# Patient Record
Sex: Female | Born: 1960 | Race: White | Hispanic: No | Smoking: Former smoker
Health system: Southern US, Community
[De-identification: ages and names within clinical notes are randomized; demographics above are authoritative.]

## PROBLEM LIST (undated history)

## (undated) DIAGNOSIS — K449 Diaphragmatic hernia without obstruction or gangrene: Secondary | ICD-10-CM

## (undated) DIAGNOSIS — R51 Headache: Secondary | ICD-10-CM

## (undated) DIAGNOSIS — M1990 Unspecified osteoarthritis, unspecified site: Secondary | ICD-10-CM

## (undated) DIAGNOSIS — E042 Nontoxic multinodular goiter: Secondary | ICD-10-CM

## (undated) DIAGNOSIS — K5730 Diverticulosis of large intestine without perforation or abscess without bleeding: Secondary | ICD-10-CM

## (undated) DIAGNOSIS — E785 Hyperlipidemia, unspecified: Secondary | ICD-10-CM

## (undated) DIAGNOSIS — Z800 Family history of malignant neoplasm of digestive organs: Secondary | ICD-10-CM

## (undated) DIAGNOSIS — E669 Obesity, unspecified: Secondary | ICD-10-CM

## (undated) DIAGNOSIS — G4730 Sleep apnea, unspecified: Secondary | ICD-10-CM

## (undated) DIAGNOSIS — E119 Type 2 diabetes mellitus without complications: Secondary | ICD-10-CM

## (undated) DIAGNOSIS — F329 Major depressive disorder, single episode, unspecified: Secondary | ICD-10-CM

## (undated) DIAGNOSIS — Z86010 Personal history of colonic polyps: Secondary | ICD-10-CM

## (undated) DIAGNOSIS — D649 Anemia, unspecified: Secondary | ICD-10-CM

## (undated) DIAGNOSIS — E7211 Homocystinuria: Secondary | ICD-10-CM

## (undated) DIAGNOSIS — N200 Calculus of kidney: Secondary | ICD-10-CM

## (undated) DIAGNOSIS — I10 Essential (primary) hypertension: Secondary | ICD-10-CM

## (undated) DIAGNOSIS — K219 Gastro-esophageal reflux disease without esophagitis: Secondary | ICD-10-CM

## (undated) DIAGNOSIS — E7212 Methylenetetrahydrofolate reductase deficiency: Secondary | ICD-10-CM

## (undated) DIAGNOSIS — F32A Depression, unspecified: Secondary | ICD-10-CM

## (undated) DIAGNOSIS — E039 Hypothyroidism, unspecified: Secondary | ICD-10-CM

## (undated) DIAGNOSIS — Z9889 Other specified postprocedural states: Secondary | ICD-10-CM

## (undated) DIAGNOSIS — R112 Nausea with vomiting, unspecified: Secondary | ICD-10-CM

## (undated) HISTORY — DX: Unspecified osteoarthritis, unspecified site: M19.90

## (undated) HISTORY — DX: Anemia, unspecified: D64.9

## (undated) HISTORY — PX: WISDOM TOOTH EXTRACTION: SHX21

## (undated) HISTORY — DX: Hypothyroidism, unspecified: E03.9

## (undated) HISTORY — DX: Homocystinuria: E72.12

## (undated) HISTORY — DX: Personal history of colonic polyps: Z86.010

## (undated) HISTORY — DX: Obesity, unspecified: E66.9

## (undated) HISTORY — PX: ABDOMINAL HYSTERECTOMY: SHX81

## (undated) HISTORY — DX: Methylenetetrahydrofolate reductase deficiency: E72.11

## (undated) HISTORY — DX: Major depressive disorder, single episode, unspecified: F32.9

## (undated) HISTORY — DX: Diaphragmatic hernia without obstruction or gangrene: K44.9

## (undated) HISTORY — DX: Type 2 diabetes mellitus without complications: E11.9

## (undated) HISTORY — DX: Nontoxic multinodular goiter: E04.2

## (undated) HISTORY — DX: Family history of malignant neoplasm of digestive organs: Z80.0

## (undated) HISTORY — DX: Depression, unspecified: F32.A

## (undated) HISTORY — DX: Essential (primary) hypertension: I10

## (undated) HISTORY — PX: BUNIONECTOMY: SHX129

## (undated) HISTORY — DX: Diverticulosis of large intestine without perforation or abscess without bleeding: K57.30

## (undated) HISTORY — DX: Hyperlipidemia, unspecified: E78.5

## (undated) HISTORY — DX: Calculus of kidney: N20.0

## (undated) HISTORY — DX: Gastro-esophageal reflux disease without esophagitis: K21.9

---

## 1988-11-05 HISTORY — PX: UMBILICAL HERNIA REPAIR: SHX196

## 1999-11-06 HISTORY — PX: OOPHORECTOMY: SHX86

## 1999-11-21 ENCOUNTER — Encounter (INDEPENDENT_AMBULATORY_CARE_PROVIDER_SITE_OTHER): Payer: Self-pay | Admitting: Specialist

## 1999-11-21 ENCOUNTER — Other Ambulatory Visit: Admission: RE | Admit: 1999-11-21 | Discharge: 1999-11-21 | Payer: Self-pay | Admitting: *Deleted

## 2000-03-14 ENCOUNTER — Other Ambulatory Visit: Admission: RE | Admit: 2000-03-14 | Discharge: 2000-03-14 | Payer: Self-pay | Admitting: Obstetrics and Gynecology

## 2000-10-16 ENCOUNTER — Encounter (INDEPENDENT_AMBULATORY_CARE_PROVIDER_SITE_OTHER): Payer: Self-pay

## 2000-10-16 ENCOUNTER — Inpatient Hospital Stay (HOSPITAL_COMMUNITY): Admission: RE | Admit: 2000-10-16 | Discharge: 2000-10-18 | Payer: Self-pay | Admitting: *Deleted

## 2002-06-09 ENCOUNTER — Other Ambulatory Visit: Admission: RE | Admit: 2002-06-09 | Discharge: 2002-06-09 | Payer: Self-pay | Admitting: *Deleted

## 2005-03-20 ENCOUNTER — Ambulatory Visit: Payer: Self-pay | Admitting: Gastroenterology

## 2007-08-06 DIAGNOSIS — Z860100 Personal history of colon polyps, unspecified: Secondary | ICD-10-CM

## 2007-08-06 HISTORY — DX: Personal history of colonic polyps: Z86.010

## 2007-08-06 HISTORY — DX: Personal history of colon polyps, unspecified: Z86.0100

## 2007-08-18 ENCOUNTER — Ambulatory Visit: Payer: Self-pay | Admitting: Gastroenterology

## 2007-09-01 ENCOUNTER — Ambulatory Visit (HOSPITAL_COMMUNITY): Admission: RE | Admit: 2007-09-01 | Discharge: 2007-09-01 | Payer: Self-pay | Admitting: Obstetrics & Gynecology

## 2007-09-03 ENCOUNTER — Encounter: Payer: Self-pay | Admitting: Gastroenterology

## 2007-09-03 ENCOUNTER — Ambulatory Visit: Payer: Self-pay | Admitting: Gastroenterology

## 2011-03-23 NOTE — Discharge Summary (Signed)
[facility]  Patient:    Alicia Lawson, Alicia Lawson                     MRN: [id number] Adm. Date:  [id number] Disc. Date: 10/18/00 Attending:  Pleas Koch                           Discharge Summary  ADMISSION DIAGNOSES:          Menometrorrhagia, family history of ovarian cancer.  DISCHARGE DIAGNOSES:          Menometrorrhagia, family history of ovarian cancer.  BRIEF HISTORY:                This is a 50 year old white female with a long history of menometrorrhagia unresponsive to all medical means.  Was admitted for total abdominal hysterectomy and bilateral salpingo-oophorectomy secondary to strong family history of ovarian cancer.  For details see history and physical.  HOSPITAL COURSE:              The patient was admitted on December 12 and underwent a total abdominal hysterectomy and bilateral salpingo-oophorectomy under general anesthetic.  The patient did well.  There was an operative blood loss of 250 cc.  Postoperatively the patient did have decreased heart rate and decreased O2 saturations while sleeping.  She was placed on oxygen and monitored in the AICU.  There was no abnormal rhythms.  There were good saturations on O2 which was then weaned off over the next 24 hours.  Patient was saturating 96% on room air by the first day.  She had good vital signs, good urinary output.  Postoperative hemoglobin was 7.4.  WBC 9.6.  By the first day she was also voiding without the catheter, ambulating.  She was advanced to low residue diet and passed flatus by the second day.  She was afebrile.  She was ambulating well.  Her incision was clean and dry.  The Alpha 200 incisional catheter was removed on the second day.  She was discharged in good condition.  A Climara 0.1 mg patch was placed which will stay on weekly then she will switch to Vivelle 0.1 changed twice weekly. Given a prescription for Percocet to take,  also detailed  discharge instructions.  She will return to the office in five days for staple removal. Call for fever, heavy bleeding, incisional difficulties, inadequate pain control, or inability to void. DD:  10/18/00 TD:  10/18/00 Job: [id number]

## 2011-03-23 NOTE — Op Note (Signed)
[facility]  Patient:    Alicia Lawson, Alicia Lawson                       MRN: [id number] Proc. Date: 10/16/00 Attending:  Georgina Peer, M.D.                           Operative Report  PREOPERATIVE DIAGNOSES:       1. Menometrorrhagia.                               2. Family history of ovarian and breast cancer.  POSTOPERATIVE DIAGNOSES:      1. Menometrorrhagia.                               2. Family history of ovarian and breast cancer.  OPERATION/PROCEDURE:          1. Total abdominal hysterectomy.                               2. Bilateral salpingo-oophorectomy.                               3. Placement of Alpha 200 incisional                                  anesthetic catheter.  SURGEON:                      Georgina Peer, M.D.  ASSISTANT:                    Rudy Jew. Ashley Royalty, M.D.  ANESTHESIA:                   General.  ESTIMATED BLOOD LOSS:         Estimated blood loss was 250 cc.  COMPLICATIONS:                None.  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  She desired permanent and comprehensive management of this bleeding problem and also wished ovarian removal because of a family history of ovarian and breast cancer.  DESCRIPTION OF PROCEDURE:     The patient was taken to the operating room after informed consent was obtained and given a general anesthetic and placed in the supine position.  The abdomen and vagina were prepped and draped sterilely and a catheter was placed in the bladder.  A transverse incision was made and taken into the peritoneal cavity in a normal Pfannenstiel manner.  A Balfour retractor was placed and the patient was placed in Trendelenburg position and the bowel was packed away.  A bladder blade was  placed.  Pelvic findings were noted.  The uterus was slightly mottled but top-normal size. The tubes and ovaries appeared normal.  There was no evidence of cyst, tumor, or endometriosis.  The upper abdomen exploration was normal and the appendix was normal.  Two Tresa Endo  clamps were placed across the cornu and the round ligaments bilaterally were divided with cautery.  The infundibulopelvic vessels were bilaterally identified and were free and clear of the ureters. These were ligated doubly and cut.  Bilaterally the uterine vessels were skeletonized.  An anterior bladder flap was made and the bladder pushed off the anterior uterine segment.  The uterine vessels bilaterally were clamped and cut and suture ligated.  The cardinal ligaments were clamped and cut and suture ligated.  The uterosacral ligaments were clamped and cut and suture ligated.  At the cervicovaginal angle the vagina was entered from the left side and Satinsky scissors were used to free the cervix from the vagina.  The cervix was then whip stitched with a running locking suture of Vicryl.  There was excellent hemostasis, with one area on the right side of the pedicles controlled with superficial sutures of 2-0 Vicryl.  The pelvis was irrigated and all blood and debris removed.  Again excellent hemostasis was noted.  The packs and retractors were removed.  The abdomen was closed with a fascial stitch of Vicryl.  An Alpha 200 catheter was placed in the subcutaneous area and then a subcutaneous stitch was placed.  Marcaine 0.25% 20 cc was infused into the subcutaneous area and the skin was closed with skin staples.  Sponge, needle, and instrument counts were correct.  Urine flow was clear at the end of the case. DD:  10/16/00 TD:  10/16/00 Job: [id number]

## 2011-04-20 ENCOUNTER — Encounter: Payer: Self-pay | Admitting: *Deleted

## 2011-04-20 ENCOUNTER — Telehealth: Payer: Self-pay | Admitting: Gastroenterology

## 2011-04-20 NOTE — Telephone Encounter (Signed)
Pt w/ hx of procedures only in systems. Hx of ECL 07/11/2001 showing Diverticulosis and HH. COLON 09/03/2007 with hyperplastic polyp - not hyperplastic or adenomatous changes or malignancy. Pt reports she does have hemorrhoids from time to time and that's probably the problem. If she went to her PCP, she might get referred, so she'll come here. Pt given an appt with Dr Jarold Motto for 04/26/11 at 0830am. I will mail her the New Pt form to complete. Pt reports blood on tissue after BM yesterday and then BRB after wiping yesterday and this am. Amy can we order some cream for her hemorrhoids prior to next week? Thanks.

## 2011-04-20 NOTE — Telephone Encounter (Signed)
Notified pt per Mike Gip, PA to use OTC Prep H until her visit on 04/26/11; pt stated understanding.

## 2011-04-20 NOTE — Telephone Encounter (Signed)
SHE CAN USE PREP H OTC UNTIL APPT- USE 3 TIMES DAILY

## 2011-04-26 ENCOUNTER — Ambulatory Visit (INDEPENDENT_AMBULATORY_CARE_PROVIDER_SITE_OTHER): Payer: 59 | Admitting: Gastroenterology

## 2011-04-26 ENCOUNTER — Encounter: Payer: Self-pay | Admitting: Gastroenterology

## 2011-04-26 ENCOUNTER — Other Ambulatory Visit (INDEPENDENT_AMBULATORY_CARE_PROVIDER_SITE_OTHER): Payer: 59

## 2011-04-26 DIAGNOSIS — K625 Hemorrhage of anus and rectum: Secondary | ICD-10-CM

## 2011-04-26 DIAGNOSIS — R109 Unspecified abdominal pain: Secondary | ICD-10-CM | POA: Insufficient documentation

## 2011-04-26 DIAGNOSIS — K649 Unspecified hemorrhoids: Secondary | ICD-10-CM | POA: Insufficient documentation

## 2011-04-26 DIAGNOSIS — K219 Gastro-esophageal reflux disease without esophagitis: Secondary | ICD-10-CM | POA: Insufficient documentation

## 2011-04-26 LAB — BASIC METABOLIC PANEL
Chloride: 110 mEq/L (ref 96–112)
Creatinine, Ser: 0.7 mg/dL (ref 0.4–1.2)
Potassium: 4.2 mEq/L (ref 3.5–5.1)

## 2011-04-26 LAB — CBC WITH DIFFERENTIAL/PLATELET
Basophils Absolute: 0 10*3/uL (ref 0.0–0.1)
Eosinophils Relative: 1.9 % (ref 0.0–5.0)
HCT: 39.7 % (ref 36.0–46.0)
Hemoglobin: 13.6 g/dL (ref 12.0–15.0)
MCV: 86.3 fl (ref 78.0–100.0)
Monocytes Relative: 5.8 % (ref 3.0–12.0)
Neutrophils Relative %: 68.6 % (ref 43.0–77.0)
Platelets: 262 10*3/uL (ref 150.0–400.0)
RBC: 4.6 Mil/uL (ref 3.87–5.11)
RDW: 14.9 % — ABNORMAL HIGH (ref 11.5–14.6)
WBC: 7.9 10*3/uL (ref 4.5–10.5)

## 2011-04-26 LAB — FOLATE: Folate: 12 ng/mL (ref >5.9)

## 2011-04-26 LAB — HEPATIC FUNCTION PANEL
AST: 22 U/L (ref 0–37)
Albumin: 3.9 g/dL (ref 3.5–5.2)
Alkaline Phosphatase: 91 U/L (ref 39–117)
Total Protein: 6.7 g/dL (ref 6.0–8.3)

## 2011-04-26 LAB — SEDIMENTATION RATE: Sed Rate: 29 mm/hr — ABNORMAL HIGH (ref 0–22)

## 2011-04-26 LAB — IBC PANEL: Saturation Ratios: 11 % — ABNORMAL LOW (ref 20.0–50.0)

## 2011-04-26 LAB — FERRITIN: Ferritin: 16.9 ng/mL (ref 10.0–291.0)

## 2011-04-26 MED ORDER — PEG-KCL-NACL-NASULF-NA ASC-C 100 G PO SOLR: 1.0000 | Freq: Once | ORAL | Status: DC

## 2011-04-26 MED ORDER — HYDROCORTISONE ACE-PRAMOXINE 1-1 % RE CREA: TOPICAL_CREAM | Freq: Two times a day (BID) | RECTAL | Status: AC

## 2011-04-26 NOTE — Progress Notes (Signed)
History of Present Illness:  This is a 50 year old Caucasian female with chronic gastroesophageal reflux disease managed fairly well with omeprazole 20 mg twice a day. She now complains of crampy abdominal pain, gas, bloating, intermittent bright red blood per rectum. She has a history of recurrent colon polyps but has been tested for genetic polyposis syndromes, results were negative. Her rectal bleeding is periodic and not associated with rectal/ abdominal pain. Her last colonoscopy was 4 years ago. The patient status post total abdominal hysterectomy and bilateral oopherectomy. The patient is concerned that she has been on PPIs for over 10 years, and wants repeat endoscopic exam. There multiple family members with colon polyps and colon carcinoma. 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 Other surgical procedures included repair of an umbilical hernia.  I have reviewed this patient's present history, medical and surgical past history, allergies and medications.    Past Medical History  Diagnosis Date  . Arthritis   . Diverticulosis of colon (without mention of hemorrhage)   . Esophageal reflux   . Hiatal hernia   . Family history of malignant neoplasm of gastrointestinal tract   . Personal history of colonic polyps 08/2007    hyperplastic  . Depression   . DM (diabetes mellitus)   . HTN (hypertension)   . Hypothyroidism   . Kidney stones   . Obesity    Past Surgical History  Procedure Date  . Abdominal hysterectomy   . Umbilical hernia repair 1990  . Bunionectomy     left    reports that she quit smoking about 27 years ago. Her smoking use included Cigarettes. She has never used smokeless tobacco. She reports that she drinks alcohol. She reports that she does not use illicit drugs. family history includes  Breast cancer in her paternal aunt; Cancer in her father; Cervical cancer in her maternal grandmother; Colon cancer in her maternal grandmother, paternal aunt, and paternal grandmother; Diabetes in an unspecified family member; Heart defect in her father; Hypertension in her mother; Lung cancer in her father; Macular degeneration in her father; Ovarian cancer in her mother; Stomach cancer in her paternal grandmother; and Thyroid disease in her father. No Known Allergies    ROS: The remainder of the 10 point ROS is negative//she does complain of allergic sinusitis, chronic fatigue, nonspecific headaches, peripheral edema, and urinary incontinency.     Physical Exam: General well developed well nourished patient in no acute distress, appearing her stated age Eyes PERRLA, no icterus, fundoscopic exam per opthamologist Skin no lesions noted Neck supple, no adenopathy, no thyroid enlargement, no tenderness Chest clear to percussion and auscultation Heart no significant murmurs, gallops or rubs noted Abdomen no hepatosplenomegaly masses or tenderness, BS normal.  Rectal inspection normal no fissures, or fistulae noted.  No masses or tenderness on digital exam. Stool guaiac negative. Anterior external hemorrhoidal tissue noted. No rectal masses or tenderness. Extremities no acute joint lesions, edema, phlebitis or evidence of cellulitis. Neurologic patient oriented x 3, cranial nerves intact, no focal neurologic deficits noted. Psychological mental status normal and normal affect.  Assessment and plan: Rectal bleeding probably from external hemorrhoid, rule out colonic polyposis per her very strong family history of colon cancer and polyps. I have asked her to do Sitz baths twice a day with local Analpram cream. Screening labs have been ordered; and she has been scheduled for colonoscopy at her convenience.  I reviewed antireflux regime with her, and we will continue omeprazole therapy pending  endoscopic exam.  Encounter Diagnoses  Name Primary?  . Rectal bleeding   . Hemorrhoids   . Abdominal pain

## 2011-04-26 NOTE — Patient Instructions (Signed)
Your procedure has been scheduled for 05/21/2011, please follow the seperate instructions.  Your prescription(s) have been sent to you pharmacy.  Please go to the basement today for your labs.

## 2011-04-27 ENCOUNTER — Telehealth: Payer: Self-pay | Admitting: *Deleted

## 2011-04-27 LAB — CELIAC PANEL 10
Gliadin IgG: 5.2 U/mL (ref <20)
IgA: 220 mg/dL (ref 69–380)
Tissue Transglutaminase Ab, IgA: 4.4 U/mL (ref <20)

## 2011-04-27 MED ORDER — TANDEM PLUS 162-115.2-1 MG PO CAPS: 1.0000 | ORAL_CAPSULE | Freq: Every day | ORAL | Status: DC

## 2011-04-27 NOTE — Telephone Encounter (Signed)
rx sent, left message she can call back if she has questions.

## 2011-04-27 NOTE — Telephone Encounter (Signed)
Message copied by Leonette Monarch on Fri Apr 27, 2011 12:18 PM ------      Message from: Jarold Motto, DAVID R      Created: Fri Apr 27, 2011 10:28 AM       Tandem daily

## 2011-05-01 ENCOUNTER — Telehealth: Payer: Self-pay | Admitting: Gastroenterology

## 2011-05-01 MED ORDER — FERROUS FUM-IRON POLYSACCH 162-115.2 MG PO CAPS: 1.0000 | ORAL_CAPSULE | Freq: Every day | ORAL | Status: DC

## 2011-05-01 NOTE — Telephone Encounter (Signed)
Pt would like tandem with no multivit, rx sent and rebate for movi prep mailed to pt.

## 2011-05-21 ENCOUNTER — Encounter: Payer: 59 | Admitting: Gastroenterology

## 2011-05-29 ENCOUNTER — Ambulatory Visit: Payer: Self-pay | Admitting: Gastroenterology

## 2011-06-11 ENCOUNTER — Encounter: Payer: Self-pay | Admitting: Gastroenterology

## 2011-06-11 ENCOUNTER — Ambulatory Visit (AMBULATORY_SURGERY_CENTER): Payer: 59 | Admitting: Gastroenterology

## 2011-06-11 DIAGNOSIS — K649 Unspecified hemorrhoids: Secondary | ICD-10-CM

## 2011-06-11 DIAGNOSIS — K219 Gastro-esophageal reflux disease without esophagitis: Secondary | ICD-10-CM

## 2011-06-11 DIAGNOSIS — R109 Unspecified abdominal pain: Secondary | ICD-10-CM | POA: Insufficient documentation

## 2011-06-11 DIAGNOSIS — Z8371 Family history of colonic polyps: Secondary | ICD-10-CM

## 2011-06-11 DIAGNOSIS — K644 Residual hemorrhoidal skin tags: Secondary | ICD-10-CM

## 2011-06-11 DIAGNOSIS — K5730 Diverticulosis of large intestine without perforation or abscess without bleeding: Secondary | ICD-10-CM

## 2011-06-11 DIAGNOSIS — K625 Hemorrhage of anus and rectum: Secondary | ICD-10-CM

## 2011-06-11 MED ORDER — SODIUM CHLORIDE 0.9 % IV SOLN: 500.0000 mL | INTRAVENOUS | Status: DC

## 2011-06-11 NOTE — Progress Notes (Signed)
When pt. Came to recovery and Dr. Jarold Motto at bedside she started crying and expressed concerns of her being awake and remembering the exam and told her she had a very difficult exam and a twisty colon and that the next exam she had she would need to be done under propofol. Pt. Still upset made team leader aware to address issue.Obtained clarification for pt. Dr. Jarold Motto stated she should repeat colonoscopy in 34yrs. Pt. Aware. Pt. Had questions about surgery for hiatal hernia Dr. Jarold Motto made aware and stated that he will schedule appt. With Dr. Daphine Deutscher, pt. Made aware.pt. Stated that she wanted to see Dr. Jarold Motto before going to see Dr. Daphine Deutscher. Dr. Jarold Motto made aware and stated that is was fine.

## 2011-06-11 NOTE — Patient Instructions (Addendum)
Resume all medications. Information given on diverticulosis and high fiber diet, D/C Instructions completed.Obtained clarification from Dr.Patterson  Verbally stated that he wants pt. To repeat procedure in 23yrs.

## 2011-06-12 ENCOUNTER — Telehealth: Payer: Self-pay

## 2011-06-12 ENCOUNTER — Encounter: Payer: Self-pay | Admitting: *Deleted

## 2011-06-12 NOTE — Telephone Encounter (Signed)
Follow up Call- Patient questions:  Do you have a fever, pain , or abdominal swelling? no Pain Score  0 *  Have you tolerated food without any problems? yes  Have you been able to return to your normal activities? no  Do you have any questions about your discharge instructions: Diet   no Medications  no Follow up visit  no  Do you have questions or concerns about your Care? no  Actions: * If pain score is 4 or above: No action needed, pain <4. Only concerns about discomfort of passing air. Able to expel air but discomfort goes away. Inform to call back with any questions or concerns.

## 2011-06-12 NOTE — Telephone Encounter (Signed)
Patient request work excuse for today. Request granted per Dr. Jarold Motto. Patient will have some one to to pick work excuse up for today.

## 2011-06-13 ENCOUNTER — Encounter: Payer: Self-pay | Admitting: Gastroenterology

## 2011-06-13 DIAGNOSIS — K219 Gastro-esophageal reflux disease without esophagitis: Secondary | ICD-10-CM

## 2011-06-13 LAB — HELICOBACTER PYLORI SCREEN-BIOPSY: UREASE: NEGATIVE

## 2011-06-15 ENCOUNTER — Encounter: Payer: Self-pay | Admitting: Gastroenterology

## 2011-06-18 ENCOUNTER — Encounter: Payer: Self-pay | Admitting: Gastroenterology

## 2011-06-20 ENCOUNTER — Encounter: Payer: Self-pay | Admitting: *Deleted

## 2011-06-22 ENCOUNTER — Encounter: Payer: Self-pay | Admitting: Gastroenterology

## 2011-06-22 ENCOUNTER — Ambulatory Visit (INDEPENDENT_AMBULATORY_CARE_PROVIDER_SITE_OTHER): Payer: 59 | Admitting: Gastroenterology

## 2011-06-22 VITALS — BP 128/84 | HR 78 | Ht 65.0 in | Wt 297.0 lb

## 2011-06-22 DIAGNOSIS — Z800 Family history of malignant neoplasm of digestive organs: Secondary | ICD-10-CM

## 2011-06-22 DIAGNOSIS — Z862 Personal history of diseases of the blood and blood-forming organs and certain disorders involving the immune mechanism: Secondary | ICD-10-CM

## 2011-06-22 DIAGNOSIS — E669 Obesity, unspecified: Secondary | ICD-10-CM

## 2011-06-22 DIAGNOSIS — K219 Gastro-esophageal reflux disease without esophagitis: Secondary | ICD-10-CM

## 2011-06-22 DIAGNOSIS — Z8639 Personal history of other endocrine, nutritional and metabolic disease: Secondary | ICD-10-CM

## 2011-06-22 DIAGNOSIS — K5730 Diverticulosis of large intestine without perforation or abscess without bleeding: Secondary | ICD-10-CM

## 2011-06-22 DIAGNOSIS — K449 Diaphragmatic hernia without obstruction or gangrene: Secondary | ICD-10-CM

## 2011-06-22 NOTE — Progress Notes (Signed)
History of Present Illness: This is a 50 year old Caucasian female with chronic obesity and thyroid dysfunction. She has had years of refractory acid reflux symptoms, and she currently is on twice a day Prilosec 40 mg and continues with regurgitation and burning substernal chest pain with some nocturnal awakening. Recent endoscopy showed a prominent hiatal hernia and early scarring of the GE junction. Colonoscopy showed a long redundant colon with diverticulosis. She has had previous 24-hour pH probe testing which is confirmed severe acid reflux upright and supine positions. I cannot find a manometry report. There is no history of Raynaud's phenomenon or collagen vascular disease. The patient is miserable with her condition and seeks surgical repair of her hiatal hernia. In the past she has been on Reglan without improvement. She does have a family history of colon cancer, but recent colonoscopy was unremarkable. Genetic testing for Lynch syndrome was negative in 2003. I have discussed surgical repair of her hiatal hernia since 2003.   Current Medications, Allergies, Past Medical History, Past Surgical History, Family History and Social History were reviewed in [employer] record.   Assessment and plan: Refractory acid reflux in an obese patient who has been unable to maintain any adequate weight loss. She has chronic hypothyroidism and is on Synthroid. I have scheduled her for esophageal manometry and referral to Dr. Luretha Murphy for consideration of laparoscopic hiatal hernia repair. I have reviewed a reflux regime with the patient again and will continue her twice a day PPI therapy. Encounter Diagnosis  Name Primary?  . Hiatal hernia Yes

## 2011-06-22 NOTE — Patient Instructions (Signed)
Your Manometry is scheduled for 07/23/2011, please follow the separate instructions.  We have referred you to Dr Luretha Murphy at [facility], either our office or theirs will give you a call back about the date and time.  You watched a movie on Gerd in the office today.  Your Colonoscopy recall is in the system for 2017 you will receive a letter in the mail when it is time for you to schedule.

## 2011-06-26 ENCOUNTER — Telehealth: Payer: Self-pay | Admitting: *Deleted

## 2011-06-26 NOTE — Telephone Encounter (Signed)
Received a call from [place] at [facility] informing us that pt has an appt with Dr Wenda Low on 07/25/11 at 1pm for a 1:30appt to discuss her Hiatal Hernia. Informed pt and mailed her directions to the office.

## 2011-07-06 ENCOUNTER — Other Ambulatory Visit: Payer: Self-pay | Admitting: *Deleted

## 2011-07-06 MED ORDER — HYDROCORTISONE ACE-PRAMOXINE 1-1 % RE CREA: TOPICAL_CREAM | Freq: Two times a day (BID) | RECTAL | Status: AC

## 2011-07-12 ENCOUNTER — Telehealth: Payer: Self-pay | Admitting: Gastroenterology

## 2011-07-12 NOTE — Telephone Encounter (Signed)
Pt lost her instructions for EM and 24hr PH Probe for 07/23/11 at 0900am at [facility]. Instructed pt to stop her Prilosec 3 days prior to exam, she may use antacids up to 12 hours prior to the test. Verified appt time with Noreene Larsson and she is to check in at admitting at 0840am; pt stated understanding.

## 2011-07-23 ENCOUNTER — Ambulatory Visit (HOSPITAL_COMMUNITY)
Admission: RE | Admit: 2011-07-23 | Discharge: 2011-07-23 | Disposition: A | Discharge status: Home / Self Care | Payer: 59 | Source: Ambulatory Visit | Attending: Gastroenterology | Admitting: Gastroenterology

## 2011-07-23 ENCOUNTER — Encounter (INDEPENDENT_AMBULATORY_CARE_PROVIDER_SITE_OTHER): Payer: 59 | Admitting: Gastroenterology

## 2011-07-23 DIAGNOSIS — K449 Diaphragmatic hernia without obstruction or gangrene: Secondary | ICD-10-CM

## 2011-07-23 DIAGNOSIS — K219 Gastro-esophageal reflux disease without esophagitis: Secondary | ICD-10-CM | POA: Insufficient documentation

## 2011-07-23 NOTE — Progress Notes (Signed)
  Esophageal manometry was completed on 07/23/2011. Results are as follows:  #1 upper esophageal sphincter-there is normal coordination between pharyngeal contraction and cricopharyngeal relaxation.  #2 lower esophageal sphincter-mean pressure is normal at 23 mm mercury with normal relaxation swallowing.  #3 motility pattern-the normally propagated peristaltic waves throughout the length of the esophagus. Mean amplitude of contractions is 87 mm of mercury.  Assessment: This is a normal esophageal manometry without any evidence of LES incompetency or an esophageal motility disorder.

## 2011-07-25 ENCOUNTER — Encounter (INDEPENDENT_AMBULATORY_CARE_PROVIDER_SITE_OTHER): Payer: Self-pay | Admitting: Surgery

## 2011-07-25 ENCOUNTER — Ambulatory Visit (INDEPENDENT_AMBULATORY_CARE_PROVIDER_SITE_OTHER): Payer: 59 | Admitting: Surgery

## 2011-07-25 DIAGNOSIS — K219 Gastro-esophageal reflux disease without esophagitis: Secondary | ICD-10-CM

## 2011-07-25 NOTE — Progress Notes (Signed)
Addended by: Latricia Heft on: 07/25/2011 03:45 PM   Modules accepted: Orders

## 2011-07-25 NOTE — Progress Notes (Signed)
Alicia Lawson comes in today for gastroesophageal reflux disease. She has however  A BMI of 51. We had a long talk about the LAP-BAND with hiatal hernia repair in the successful treatment of gastroesophageal reflux disease as well as weight loss. She's had prediabetes in the past. I gave her some information on the LAP-BAND as well as a laparoscopic Nissen fundoplication. I drew her pictures of both procedures explain him in some detail.  Before proceeding forward I will get an upper GI series to assess her hiatal hernia. I will see her back after that and based on that if she decided she wanted to pursue LAP-BAND we will then have her see Okey Regal for workup for a laparoscopic adjustable gastric banding.  Plan upper GI series, repeat complete metabolic panel, hemoglobin A1c.

## 2011-07-26 ENCOUNTER — Encounter: Payer: Self-pay | Admitting: Gastroenterology

## 2011-07-30 ENCOUNTER — Other Ambulatory Visit (INDEPENDENT_AMBULATORY_CARE_PROVIDER_SITE_OTHER): Payer: Self-pay | Admitting: Surgery

## 2011-07-30 ENCOUNTER — Ambulatory Visit
Admission: RE | Admit: 2011-07-30 | Discharge: 2011-07-30 | Disposition: A | Discharge status: Home / Self Care | Payer: 59 | Source: Ambulatory Visit | Attending: Surgery | Admitting: Surgery

## 2011-07-30 DIAGNOSIS — K219 Gastro-esophageal reflux disease without esophagitis: Secondary | ICD-10-CM

## 2011-07-30 LAB — COMPREHENSIVE METABOLIC PANEL
AST: 17 U/L (ref 0–37)
Albumin: 4.2 g/dL (ref 3.5–5.2)
Alkaline Phosphatase: 79 U/L (ref 39–117)
BUN: 13 mg/dL (ref 6–23)
Potassium: 4.6 mEq/L (ref 3.5–5.3)
Sodium: 144 mEq/L (ref 135–145)
Total Bilirubin: 0.4 mg/dL (ref 0.3–1.2)

## 2011-07-30 LAB — HEMOGLOBIN A1C: Mean Plasma Glucose: 120 mg/dL — ABNORMAL HIGH (ref <117)

## 2011-08-06 DIAGNOSIS — E042 Nontoxic multinodular goiter: Secondary | ICD-10-CM

## 2011-08-06 HISTORY — DX: Nontoxic multinodular goiter: E04.2

## 2011-08-09 ENCOUNTER — Telehealth (INDEPENDENT_AMBULATORY_CARE_PROVIDER_SITE_OTHER): Payer: Self-pay | Admitting: General Surgery

## 2011-08-09 NOTE — Telephone Encounter (Addendum)
The patient Alicia Lawson on my VM regarding scheduling an appt to follow up on her Hiatal Hernia and results to UGI tried to call the patient back and no answer, Alicia Lawson for her to call me back to schedule. Pt scheduled to come in to office on 09/26/11 she has been put on the wait list for a cancellation if there is a sooner appt.

## 2011-08-14 ENCOUNTER — Telehealth (INDEPENDENT_AMBULATORY_CARE_PROVIDER_SITE_OTHER): Payer: Self-pay | Admitting: General Surgery

## 2011-08-14 NOTE — Telephone Encounter (Signed)
Patient would like lab results sent to her family physician. Also she is wanting to see if she would be a candidate for lap band. She is scheduled for a follow up 09/26/11

## 2011-08-22 ENCOUNTER — Other Ambulatory Visit: Payer: Self-pay | Admitting: Family Medicine

## 2011-08-22 DIAGNOSIS — E049 Nontoxic goiter, unspecified: Secondary | ICD-10-CM

## 2011-08-27 ENCOUNTER — Ambulatory Visit
Admission: RE | Admit: 2011-08-27 | Discharge: 2011-08-27 | Disposition: A | Discharge status: Home / Self Care | Payer: 59 | Source: Ambulatory Visit | Attending: Family Medicine | Admitting: Family Medicine

## 2011-08-27 DIAGNOSIS — E049 Nontoxic goiter, unspecified: Secondary | ICD-10-CM

## 2011-09-26 ENCOUNTER — Other Ambulatory Visit (INDEPENDENT_AMBULATORY_CARE_PROVIDER_SITE_OTHER): Payer: Self-pay | Admitting: General Surgery

## 2011-09-26 ENCOUNTER — Ambulatory Visit (INDEPENDENT_AMBULATORY_CARE_PROVIDER_SITE_OTHER): Payer: 59 | Admitting: Surgery

## 2011-09-26 ENCOUNTER — Encounter (INDEPENDENT_AMBULATORY_CARE_PROVIDER_SITE_OTHER): Payer: Self-pay | Admitting: Surgery

## 2011-09-26 DIAGNOSIS — K219 Gastro-esophageal reflux disease without esophagitis: Secondary | ICD-10-CM

## 2011-09-26 DIAGNOSIS — E669 Obesity, unspecified: Secondary | ICD-10-CM

## 2011-09-26 NOTE — Progress Notes (Signed)
Patient ID: Alicia Lawson, female   DOB: 1960/12/02, 50 y.o.   MRN: [id number] Chief Complaint:  Severe GERD with pyrosis and mechanical reflux at night  History of Present Illness:  Alicia Lawson is an 50 y.o. female who was referred to me for her severe GERD. Without proton pump inhibitors she would have constant chest pain. She describes reflux with aspiration. An upper GI demonstrated a small hiatal hernia.  Her current BMI is in the upper 40s. I think that she would be best managed with a lap band with hiatal hernia repair. I think this would have greater success than a Nissen fundoplication if the patient is overweight.she wants to pursue this. She has been to one of our weight loss seminars. She has read the book on LAP-BAND. She has 5 years of weights from her doctor, Cephus Richer in [place].    Past Medical History  Diagnosis Date  . Arthritis   . Diverticulosis of colon (without mention of hemorrhage)   . Esophageal reflux   . Family history of malignant neoplasm of gastrointestinal tract   . Personal history of colonic polyps 08/2007    hyperplastic  . Depression   . DM (diabetes mellitus)   . HTN (hypertension)   . Obesity   . Kidney stones   . Hiatal hernia   . Hypothyroidism     hashimoto  . Diverticulosis of colon (without mention of hemorrhage)   . Anemia   . Hyperlipidemia   . Multiple thyroid nodules October 2012     Past Surgical History  Procedure Date  . Abdominal hysterectomy   . Umbilical hernia repair 1990  . Bunionectomy     left  . Wisdom tooth extraction   . Oophorectomy 2001    Medications Prior to Admission  Medication Sig Dispense Refill  . AMBULATORY NON FORMULARY MEDICATION Estiol cream 2.5 use topically Mon-Fri       . Ascorbic Acid (VITAMIN C) 1000 MG tablet Take 1,000 mg by mouth 3 (three) times daily.        . Cholecalciferol (VITAMIN D) 1000 UNITS capsule Take 2-3 by mouth daily       . clobetasol (TEMOVATE) 0.05 % ointment Apply topically  as needed.      . Diclofenac-Misoprostol (ARTHROTEC PO) Take by mouth daily.        . ferrous fumarate-iron polysaccharide complex (TANDEM) 162-115.2 MG CAPS Take 1 capsule by mouth daily with breakfast.  30 capsule  6  . fluticasone (FLONASE) 50 MCG/ACT nasal spray       . losartan (COZAAR) 50 MG tablet       . Multiple Vitamin (MULTIVITAMIN) tablet Take 1 tablet by mouth daily.        . Multiple Vitamins-Minerals (VISION VITAMINS) TABS Take by mouth 1 day or 1 dose.        . Omega-3 Fatty Acids (FISH OIL) 1200 MG CAPS Take 3-6 by mouth daily       . omeprazole (PRILOSEC) 40 MG capsule Take 40 mg by mouth 2 (two) times daily.        . progesterone (PROMETRIUM) 100 MG capsule Take 100 mg by mouth daily.        . solifenacin (VESICARE) 5 MG tablet Take 10 mg by mouth daily.        Marland Kitchen thyroid (ARMOUR THYROID) 60 MG tablet Take 60 mg by mouth daily.        . traZODone (DESYREL) 50 MG tablet daily.  Medications Prior to Admission  Medication Dose Route Frequency Provider Last Rate Last Dose  . 0.9 %  sodium chloride infusion  500 mL Intravenous Continuous Sheryn Bison, MD       No Known Allergies Family History  Problem Relation Age of Onset  . Thyroid disease Father   . Heart defect Father   . Cancer Father     bladder and lung  . Macular degeneration Father   . Lung cancer Father   . Ovarian cancer Mother   . Hypertension Mother   . Cancer Mother     ovarian  . Colon cancer Paternal Grandmother   . Cancer Paternal Grandmother     colon  . Cervical cancer Maternal Grandmother   . Cancer Maternal Grandmother     cervical  . Colon cancer Paternal Aunt   . Cancer Paternal Aunt     breast  . Breast cancer Paternal Aunt   . Diabetes      grandparents  . Esophageal cancer Neg Hx   . Colon cancer Cousin    Social History:   reports that she quit smoking about 27 years ago. Her smoking use included Cigarettes. She has never used smokeless tobacco. She reports that she drinks  alcohol. She reports that she does not use illicit drugs.   REVIEW OF SYSTEMS - PERTINENT POSITIVES ONLY: Nothing notable except as in HPI  Physical Exam:   Blood pressure 146/94, pulse 68, temperature 97.2 F (36.2 C), temperature source Temporal, resp. rate 16, height 5' 5.5" (1.664 m), weight 306 lb 6 oz (138.971 kg). Body mass index is 50.21 kg/(m^2).  Gen:  No acute distress.  Well nourished and well groomed.   Neurological: Alert and oriented to person, place, and time. Coordination normal.  Head: Normocephalic and atraumatic.  Eyes: Conjunctivae are normal. Pupils are equal, round, and reactive to light. No scleral icterus.  Neck: Normal range of motion. Neck supple. No tracheal deviation or thyromegaly present.  Cardiovascular:  SR without murmurs or gallops Respiratory: Effort normal.  No respiratory distress. No chest wall tenderness. Breath sounds normal.  No wheezes, rales or rhonchi.  GI: Soft. Bowel sounds are normal. The abdomen is soft and nontender.  There is no rebound and no guarding.Obese GU: Musculoskeletal: Normal range of motion. Extremities are nontender.  Lymphadenopathy: No cervical, preauricular, postauricular or axillary adenopathy is present Skin: Skin is warm and dry. No rash noted. No diaphoresis. No erythema. No pallor. No clubbing, cyanosis, or edema.  Pscyh: Normal mood and affect. Behavior is normal. Judgment and thought content normal.   LABORATORY RESULTS: No results found for this or any previous visit (from the past 48 hour(s)).  RADIOLOGY RESULTS: No results found.  Problem List: Active Problems:  * No active hospital problems. *    Assessment & Plan: I think she is a good candidate for a lap band hiatal hernia. Will begin the workup for getting [place] certified with [place] [facility].    Matt B. Daphine Deutscher, MD, [facility] [phone] beeper [phone]  09/26/2011 4:12 PM

## 2011-10-01 ENCOUNTER — Other Ambulatory Visit: Payer: Self-pay | Admitting: Family Medicine

## 2011-10-01 DIAGNOSIS — Z1231 Encounter for screening mammogram for malignant neoplasm of breast: Secondary | ICD-10-CM

## 2011-10-03 ENCOUNTER — Other Ambulatory Visit: Payer: Self-pay | Admitting: Endocrinology

## 2011-10-03 DIAGNOSIS — E042 Nontoxic multinodular goiter: Secondary | ICD-10-CM

## 2011-10-04 ENCOUNTER — Ambulatory Visit
Admission: RE | Admit: 2011-10-04 | Discharge: 2011-10-04 | Disposition: A | Discharge status: Home / Self Care | Payer: 59 | Source: Ambulatory Visit | Attending: Endocrinology | Admitting: Endocrinology

## 2011-10-04 ENCOUNTER — Other Ambulatory Visit (INDEPENDENT_AMBULATORY_CARE_PROVIDER_SITE_OTHER): Payer: Self-pay | Admitting: Surgery

## 2011-10-04 ENCOUNTER — Other Ambulatory Visit (HOSPITAL_COMMUNITY)
Admission: RE | Admit: 2011-10-04 | Discharge: 2011-10-04 | Disposition: A | Discharge status: Home / Self Care | Payer: 59 | Source: Ambulatory Visit | Attending: Radiology | Admitting: Radiology

## 2011-10-04 DIAGNOSIS — E042 Nontoxic multinodular goiter: Secondary | ICD-10-CM

## 2011-10-04 DIAGNOSIS — E049 Nontoxic goiter, unspecified: Secondary | ICD-10-CM | POA: Insufficient documentation

## 2011-10-04 NOTE — Procedures (Signed)
US guided needle aspirate biopsies performed of both right upper pole and left mid pole thyroid nodules(x3 each) via 25 gauge needles. No immediate complications. Final pathology pending.

## 2011-10-05 LAB — COMPREHENSIVE METABOLIC PANEL
ALT: 22 U/L (ref 0–35)
AST: 15 U/L (ref 0–37)
Alkaline Phosphatase: 85 U/L (ref 39–117)
CO2: 25 mEq/L (ref 19–32)
Creat: 0.81 mg/dL (ref 0.50–1.10)
Sodium: 142 mEq/L (ref 135–145)
Total Bilirubin: 0.4 mg/dL (ref 0.3–1.2)
Total Protein: 6.6 g/dL (ref 6.0–8.3)

## 2011-10-05 LAB — CBC
MCH: 29 pg (ref 26.0–34.0)
MCV: 87.5 fL (ref 78.0–100.0)
Platelets: 255 10*3/uL (ref 150–400)
RBC: 4.73 MIL/uL (ref 3.87–5.11)
RDW: 13.9 % (ref 11.5–15.5)

## 2011-10-05 LAB — TSH: TSH: 0.777 u[IU]/mL (ref 0.350–4.500)

## 2011-10-05 LAB — HEMOGLOBIN A1C: Mean Plasma Glucose: 117 mg/dL — ABNORMAL HIGH (ref <117)

## 2011-10-10 ENCOUNTER — Ambulatory Visit
Admission: RE | Admit: 2011-10-10 | Discharge: 2011-10-10 | Disposition: A | Discharge status: Home / Self Care | Payer: 59 | Source: Ambulatory Visit | Attending: Family Medicine | Admitting: Family Medicine

## 2011-10-10 DIAGNOSIS — Z1231 Encounter for screening mammogram for malignant neoplasm of breast: Secondary | ICD-10-CM

## 2011-10-17 ENCOUNTER — Encounter: Payer: 59 | Attending: Surgery | Admitting: *Deleted

## 2011-10-17 ENCOUNTER — Encounter: Payer: Self-pay | Admitting: *Deleted

## 2011-10-17 DIAGNOSIS — Z01818 Encounter for other preprocedural examination: Secondary | ICD-10-CM | POA: Insufficient documentation

## 2011-10-17 DIAGNOSIS — Z713 Dietary counseling and surveillance: Secondary | ICD-10-CM | POA: Insufficient documentation

## 2011-10-17 NOTE — Patient Instructions (Signed)
   Follow Pre-Op Nutrition Goals to prepare for Gastric Bypass Surgery.   Call the [facility] at [phone] once you have been given your surgery date to enrolled in the Pre-Op Nutrition Class. You will need to attend this nutrition class 3-4 weeks prior to your surgery. 

## 2011-10-17 NOTE — Progress Notes (Signed)
  Pre-Op Assessment Visit: Pre-Operative Gastric Bypass Surgery  Medical Nutrition Therapy:  Appt start time: 1200 end time:  1300.  Patient was seen on 10/17/2011 for Pre-Operative LAGB Nutrition Assessment. Assessment and letter of approval faxed to [facility] B[facility]ric [facility] Program coordinator on 10/17/2011.  Approval letter sent to [facility] center and will be available in the chart under the media tab.  Handouts given during visit include:  Pre-Op Goals Handout  Patient to call for Pre-Op and Post-Op Nutrition Education at the [facility] when surgery is scheduled.

## 2011-10-25 ENCOUNTER — Encounter (INDEPENDENT_AMBULATORY_CARE_PROVIDER_SITE_OTHER): Payer: Self-pay

## 2011-11-16 ENCOUNTER — Other Ambulatory Visit: Payer: Self-pay | Admitting: Dermatology

## 2011-12-12 NOTE — Progress Notes (Signed)
  Bariatric Class:  Appt start time: 0830 end time:  0930.  Pre-Operative Nutrition Class  Patient was seen on 12/13/11  for Pre-Operative Bariatric Surgery Education at the [facility].  Surgery date: 01/08/12 Surgery type: LAGB  Last weight: 304.5 lbs  Samples given per MNT protocol: Bariatric Advantage Multivitamin Lot # [id number] Exp: 9/13  Bariatric Advantage Calcium Citrate Lot # [id number] Exp: 10/13  Celebrate Vitamins Multivitamin Lot # [id number] Exp: 6/14  Celebrate VitaminsCalcium Citrate Lot # [id number] Exp: 5/14  Jerrilyn [place] Lot# [id number] Exp: 5/14  The following the learning objective met by the patient during this course:   Identifies Pre-Op Dietary Goals and will begin 2 weeks pre-operatively   Identifies appropriate sources of fluids and proteins   States protein recommendations and appropriate sources pre and post-operatively  Identifies Post-Operative Dietary Goals and will follow for 2 weeks post-operatively  Identifies appropriate multivitamin and calcium sources  Describes the need for physical activity post-operatively and will follow MD recommendations  States when to call healthcare provider regarding medication questions or post-operative complications  Handouts given during class include:  Pre-Op Bariatric Surgery Diet Handout  Protein Shake Handout  Post-Op Bariatric Surgery Nutrition Handout  BELT Program Information Flyer  Support Group Information Flyer  Follow-Up Plan: Patient will follow-up at [facility] 2 weeks post operatively for diet advancement per MD.

## 2011-12-12 NOTE — Patient Instructions (Signed)
Follow:    Pre-Op Diet per MD 2 weeks prior to surgery  Phase 2- Liquids (clear/full) 2 weeks after surgery  Vitamin/Mineral/Calcium guidelines for purchasing bariatric supplements  Exercise guidelines pre and post-op per MD  Follow-up at [facility] in 2 weeks post-op for diet advancement. Contact Jaquail Mclees as needed with questions/concerns.  

## 2011-12-13 ENCOUNTER — Encounter: Payer: 59 | Attending: Surgery | Admitting: *Deleted

## 2011-12-13 DIAGNOSIS — Z01818 Encounter for other preprocedural examination: Secondary | ICD-10-CM | POA: Insufficient documentation

## 2011-12-13 DIAGNOSIS — Z713 Dietary counseling and surveillance: Secondary | ICD-10-CM | POA: Insufficient documentation

## 2011-12-27 ENCOUNTER — Other Ambulatory Visit (INDEPENDENT_AMBULATORY_CARE_PROVIDER_SITE_OTHER): Payer: Self-pay | Admitting: Surgery

## 2011-12-28 ENCOUNTER — Ambulatory Visit (INDEPENDENT_AMBULATORY_CARE_PROVIDER_SITE_OTHER): Payer: 59 | Admitting: Surgery

## 2011-12-28 ENCOUNTER — Encounter (INDEPENDENT_AMBULATORY_CARE_PROVIDER_SITE_OTHER): Payer: Self-pay | Admitting: Surgery

## 2011-12-28 DIAGNOSIS — E669 Obesity, unspecified: Secondary | ICD-10-CM

## 2011-12-28 DIAGNOSIS — K449 Diaphragmatic hernia without obstruction or gangrene: Secondary | ICD-10-CM

## 2011-12-28 NOTE — Progress Notes (Signed)
Chief Complaint:  BMI of 50 with desire for lap band and hiatal hernia repair  History of Present Illness:  Alicia Lawson is an 51 y.o. female who was initially referred to me for her gastroesophageal reflux disease. However she has a hiatal hernia with significant GERD. We have her approved for lapband hiatal hernia repair. She has signed our operative permit and have answered her questions. Given her oral bowel prep and she notices failed a low carb diet preoperatively. Her lab and is scheduled for March 5.  Past Medical History  Diagnosis Date  . Diverticulosis of colon (without mention of hemorrhage)   . Esophageal reflux   . Family history of malignant neoplasm of gastrointestinal tract   . Personal history of colonic polyps 08/2007    hyperplastic  . Depression   . HTN (hypertension)   . Obesity   . Kidney stones   . Hypothyroidism     hashimoto  . Diverticulosis of colon (without mention of hemorrhage)   . Anemia   . Hyperlipidemia   . Multiple thyroid nodules October 2012   . Arthritis   . DM (diabetes mellitus)     Pt believes she is Pre-Diabetic  . Hiatal hernia     Past Surgical History  Procedure Date  . Abdominal hysterectomy   . Umbilical hernia repair 1990  . Bunionectomy     left  . Wisdom tooth extraction   . Oophorectomy 2001    Current Outpatient Prescriptions  Medication Sig Dispense Refill  . AMBULATORY NON FORMULARY MEDICATION Estiol cream 2.5 use topically Mon-Fri       . Ascorbic Acid (VITAMIN C) 1000 MG tablet Take 1,000 mg by mouth 3 (three) times daily.        . Cholecalciferol (VITAMIN D) 1000 UNITS capsule Take 2-3 by mouth daily       . clobetasol (TEMOVATE) 0.05 % ointment Apply topically as needed.      . Diclofenac-Misoprostol (ARTHROTEC PO) Take by mouth daily.        . ferrous fumarate-iron polysaccharide complex (TANDEM) 162-115.2 MG CAPS Take 1 capsule by mouth daily with breakfast.  30 capsule  6  . fluticasone (FLONASE) 50 MCG/ACT  nasal spray       . losartan (COZAAR) 50 MG tablet       . Multiple Vitamin (MULTIVITAMIN) tablet Take 1 tablet by mouth daily.        . Multiple Vitamins-Minerals (VISION VITAMINS) TABS Take by mouth 1 day or 1 dose.        . Omega-3 Fatty Acids (FISH OIL) 1200 MG CAPS Take 3-6 by mouth daily       . omeprazole (PRILOSEC) 40 MG capsule Take 40 mg by mouth 2 (two) times daily.        . Probiotic Product (PROBIOTIC FORMULA PO) Take 50 mg by mouth.        . progesterone (PROMETRIUM) 100 MG capsule Take 100 mg by mouth daily.        . solifenacin (VESICARE) 5 MG tablet Take 10 mg by mouth daily.        Marland Kitchen thyroid (ARMOUR THYROID) 60 MG tablet Take 60 mg by mouth daily.        . traZODone (DESYREL) 50 MG tablet daily.       Current Facility-Administered Medications  Medication Dose Route Frequency Provider Last Rate Last Dose  . 0.9 %  sodium chloride infusion  500 mL Intravenous Continuous Sheryn Bison, MD  Review of patient's allergies indicates no known allergies. Family History  Problem Relation Age of Onset  . Thyroid disease Father   . Heart defect Father   . Cancer Father     bladder and lung  . Macular degeneration Father   . Lung cancer Father   . Ovarian cancer Mother   . Hypertension Mother   . Cancer Mother     ovarian  . Colon cancer Paternal Grandmother   . Cancer Paternal Grandmother     colon  . Cervical cancer Maternal Grandmother   . Cancer Maternal Grandmother     cervical  . Colon cancer Paternal Aunt   . Cancer Paternal Aunt     breast  . Breast cancer Paternal Aunt   . Diabetes      grandparents  . Esophageal cancer Neg Hx   . Colon cancer Cousin    Social History:   reports that she quit smoking about 28 years ago. Her smoking use included Cigarettes. She has never used smokeless tobacco. She reports that she drinks alcohol. She reports that she does not use illicit drugs.   REVIEW OF SYSTEMS - PERTINENT POSITIVES ONLY: Is positive for loss of  sleep and fatigue, has she monos thyroiditis incontinence, headaches, anemia, hoarseness and sore throat, joint pain arthritis. She is divorced and has 3 children. She doesn't smoke rarely drinks.  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 rate 18, height 5' 5.5" (1.664 m), weight 308 lb 8 oz (139.935 kg). Body mass index is 50.56 kg/(m^2).  Gen:  WDWN white female NAD  Neurological: Alert and oriented to person, place, and time. Motor and sensory function is grossly intact  Head: Normocephalic and atraumatic.  Eyes: Conjunctivae are normal. Pupils are equal, round, and reactive to light. No scleral icterus.  Neck: Normal range of motion. Neck supple. No tracheal deviation or thyromegaly present.  Cardiovascular:  SR without murmurs or gallops.  No carotid bruits Respiratory: Effort normal.  No respiratory distress. No chest wall tenderness. Breath sounds normal.  No wheezes, rales or rhonchi.  Abdomen:  nontender GU: Musculoskeletal: Normal range of motion. Extremities are nontender. No cyanosis, edema or clubbing noted Lymphadenopathy: No cervical, preauricular, postauricular or axillary adenopathy is present Skin: Skin is warm and dry. No rash noted. No diaphoresis. No erythema. No pallor. Pscyh: Normal mood and affect. Behavior is normal. Judgment and thought content normal.   LABORATORY RESULTS: No results found for this or any previous visit (from the past 48 hour(s)).  RADIOLOGY RESULTS: No results found.  Problem List: Patient Active Problem List  Diagnoses  . Rectal bleeding  . Hemorrhoids  . Abdominal pain  . GERD (gastroesophageal reflux disease)  . Hemorrhage of rectum and anus  . Unspecified hemorrhoids without mention of complication  . Abdominal pain, unspecified site  . Diverticulosis of colon (without mention of  hemorrhage)  . External hemorrhoids  . Hiatal hernia  . Obesity  . H/O: hypothyroidism    Assessment & Plan: Symptomatic GERD and morbid obesity. Hiatal hernia on upper GI plan laparoscopic chest gastric band and hiatal hernia repair.    Matt B. Daphine Deutscher, MD, [facility] [phone] beeper [phone]  12/28/2011 4:29 PM

## 2012-01-01 ENCOUNTER — Encounter (HOSPITAL_COMMUNITY)
Admission: RE | Admit: 2012-01-01 | Discharge: 2012-01-01 | Disposition: A | Discharge status: Home / Self Care | Payer: 59 | Source: Ambulatory Visit | Attending: Surgery | Admitting: Surgery

## 2012-01-01 ENCOUNTER — Ambulatory Visit (HOSPITAL_COMMUNITY)
Admission: RE | Admit: 2012-01-01 | Discharge: 2012-01-01 | Disposition: A | Discharge status: Home / Self Care | Payer: 59 | Source: Ambulatory Visit | Attending: Surgery | Admitting: Surgery

## 2012-01-01 ENCOUNTER — Encounter (HOSPITAL_COMMUNITY): Payer: Self-pay

## 2012-01-01 DIAGNOSIS — K449 Diaphragmatic hernia without obstruction or gangrene: Secondary | ICD-10-CM | POA: Insufficient documentation

## 2012-01-01 DIAGNOSIS — E119 Type 2 diabetes mellitus without complications: Secondary | ICD-10-CM | POA: Insufficient documentation

## 2012-01-01 DIAGNOSIS — Z01818 Encounter for other preprocedural examination: Secondary | ICD-10-CM | POA: Insufficient documentation

## 2012-01-01 DIAGNOSIS — I10 Essential (primary) hypertension: Secondary | ICD-10-CM | POA: Insufficient documentation

## 2012-01-01 DIAGNOSIS — E039 Hypothyroidism, unspecified: Secondary | ICD-10-CM | POA: Insufficient documentation

## 2012-01-01 DIAGNOSIS — Z01812 Encounter for preprocedural laboratory examination: Secondary | ICD-10-CM | POA: Insufficient documentation

## 2012-01-01 DIAGNOSIS — K219 Gastro-esophageal reflux disease without esophagitis: Secondary | ICD-10-CM | POA: Insufficient documentation

## 2012-01-01 DIAGNOSIS — G4730 Sleep apnea, unspecified: Secondary | ICD-10-CM | POA: Insufficient documentation

## 2012-01-01 HISTORY — DX: Sleep apnea, unspecified: G47.30

## 2012-01-01 HISTORY — DX: Other specified postprocedural states: R11.2

## 2012-01-01 HISTORY — DX: Headache: R51

## 2012-01-01 HISTORY — DX: Other specified postprocedural states: Z98.890

## 2012-01-01 LAB — DIFFERENTIAL
Basophils Absolute: 0 10*3/uL (ref 0.0–0.1)
Eosinophils Absolute: 0.2 10*3/uL (ref 0.0–0.7)
Eosinophils Relative: 2 % (ref 0–5)
Lymphocytes Relative: 21 % (ref 12–46)
Lymphs Abs: 1.6 10*3/uL (ref 0.7–4.0)
Monocytes Absolute: 0.5 10*3/uL (ref 0.1–1.0)

## 2012-01-01 LAB — COMPREHENSIVE METABOLIC PANEL
ALT: 17 U/L (ref 0–35)
AST: 13 U/L (ref 0–37)
Alkaline Phosphatase: 86 U/L (ref 39–117)
CO2: 24 mEq/L (ref 19–32)
Calcium: 9.9 mg/dL (ref 8.4–10.5)
Chloride: 103 mEq/L (ref 96–112)
GFR calc Af Amer: >90 mL/min (ref >90)
GFR calc non Af Amer: >90 mL/min (ref >90)
Glucose, Bld: 109 mg/dL — ABNORMAL HIGH (ref 70–99)
Potassium: 4.1 mEq/L (ref 3.5–5.1)
Sodium: 138 mEq/L (ref 135–145)

## 2012-01-01 LAB — CBC
Hemoglobin: 14.4 g/dL (ref 12.0–15.0)
MCH: 29 pg (ref 26.0–34.0)
Platelets: 271 10*3/uL (ref 150–400)
RBC: 4.97 MIL/uL (ref 3.87–5.11)
WBC: 7.8 10*3/uL (ref 4.0–10.5)

## 2012-01-01 LAB — SURGICAL PCR SCREEN
MRSA, PCR: NEGATIVE
Staphylococcus aureus: NEGATIVE

## 2012-01-01 NOTE — Pre-Procedure Instructions (Signed)
01/01/12 CXR report from 01/01/12 called to [facility] at Dr Ermalene Searing office along with BUN result from today's labs. She stated she would let DR Daphine Deutscher be aware.

## 2012-01-01 NOTE — Pre-Procedure Instructions (Signed)
01/01/12 Called office of Dr Sheron Nightingale and questioned why hernia was not listed on consent.  French Ana stated that is included in lap band.  French Ana talked with pt regarding this and regarding time and bowel prep.

## 2012-01-01 NOTE — Patient Instructions (Signed)
20 Nathaniel Yaden  01/01/2012   Your procedure is scheduled on:  01/08/12   Report to [facility] at  AM.  Call this number if you have problems the morning of surgery: [phone]   Remember:   Do not eat food:After Midnight.  May have clear liquids:until Midnight .  Marland Kitchen  Take these medicines the morning of surgery with A SIP OF WATER:    Do not wear jewelry, make-up or nail polish.  Do not wear lotions, powders, or perfumes.   Do not shave 48 hours prior to surgery.  Do not bring valuables to the hospital.  Contacts, dentures or bridgework may not be worn into surgery.       Patients discharged the day of surgery will not be allowed to drive home.  Name and phone number of your driver:   Special Instructions: CHG Shower Use Special Wash: 1/2 bottle night before surgery and 1/2 bottle morning of surgery. shower chin to toes with CHG.  Wash face and private parts with regular soap.     Please read over the following fact sheets that you were given: MRSA Information, coughing and deep breathing exercises, leg exercises

## 2012-01-07 MED ORDER — DEXTROSE 5 % IV SOLN
2.0000 g | INTRAVENOUS | Status: AC
  Administered 2012-01-08: 2 g via INTRAVENOUS
  Filled 2012-01-07: qty 2

## 2012-01-08 ENCOUNTER — Ambulatory Visit (HOSPITAL_COMMUNITY)
Admission: RE | Admit: 2012-01-08 | Discharge: 2012-01-09 | DRG: 621 | Disposition: A | Discharge status: Home / Self Care | Payer: 59 | Source: Ambulatory Visit | Attending: Surgery | Admitting: Surgery

## 2012-01-08 ENCOUNTER — Encounter (HOSPITAL_COMMUNITY): Payer: Self-pay | Admitting: Anesthesiology

## 2012-01-08 ENCOUNTER — Ambulatory Visit (HOSPITAL_COMMUNITY): Payer: 59 | Admitting: Anesthesiology

## 2012-01-08 ENCOUNTER — Encounter (HOSPITAL_COMMUNITY): Payer: Self-pay | Admitting: *Deleted

## 2012-01-08 ENCOUNTER — Encounter (HOSPITAL_COMMUNITY)
Admission: RE | Disposition: A | Discharge status: Home / Self Care | Payer: Self-pay | Source: Ambulatory Visit | Attending: Surgery

## 2012-01-08 DIAGNOSIS — K219 Gastro-esophageal reflux disease without esophagitis: Secondary | ICD-10-CM

## 2012-01-08 DIAGNOSIS — K649 Unspecified hemorrhoids: Secondary | ICD-10-CM

## 2012-01-08 DIAGNOSIS — E039 Hypothyroidism, unspecified: Secondary | ICD-10-CM | POA: Insufficient documentation

## 2012-01-08 DIAGNOSIS — K449 Diaphragmatic hernia without obstruction or gangrene: Secondary | ICD-10-CM | POA: Insufficient documentation

## 2012-01-08 DIAGNOSIS — K2100 Gastro-esophageal reflux disease with esophagitis, without bleeding: Secondary | ICD-10-CM

## 2012-01-08 DIAGNOSIS — I10 Essential (primary) hypertension: Secondary | ICD-10-CM | POA: Insufficient documentation

## 2012-01-08 DIAGNOSIS — Z79899 Other long term (current) drug therapy: Secondary | ICD-10-CM | POA: Insufficient documentation

## 2012-01-08 DIAGNOSIS — K625 Hemorrhage of anus and rectum: Secondary | ICD-10-CM

## 2012-01-08 DIAGNOSIS — Z6841 Body Mass Index (BMI) 40.0 and over, adult: Secondary | ICD-10-CM | POA: Insufficient documentation

## 2012-01-08 DIAGNOSIS — E785 Hyperlipidemia, unspecified: Secondary | ICD-10-CM | POA: Insufficient documentation

## 2012-01-08 HISTORY — PX: LAPAROSCOPIC GASTRIC BANDING: SHX1100

## 2012-01-08 LAB — CBC
MCH: 28.9 pg (ref 26.0–34.0)
Platelets: 266 10*3/uL (ref 150–400)
RBC: 4.67 MIL/uL (ref 3.87–5.11)
WBC: 10.7 10*3/uL — ABNORMAL HIGH (ref 4.0–10.5)

## 2012-01-08 LAB — CREATININE, SERUM
Creatinine, Ser: 0.68 mg/dL (ref 0.50–1.10)
GFR calc Af Amer: >90 mL/min (ref >90)

## 2012-01-08 SURGERY — GASTRIC BANDING, LAPAROSCOPIC
Anesthesia: General | Site: Abdomen | Wound class: Clean Contaminated

## 2012-01-08 MED ORDER — LOSARTAN POTASSIUM 50 MG PO TABS
100.0000 mg | ORAL_TABLET | Freq: Every day | ORAL | Status: DC
  Filled 2012-01-08 (×2): qty 2

## 2012-01-08 MED ORDER — NEOSTIGMINE METHYLSULFATE 1 MG/ML IJ SOLN
INTRAMUSCULAR | Status: DC | PRN
  Administered 2012-01-08: 4 mg via INTRAVENOUS

## 2012-01-08 MED ORDER — MEPERIDINE HCL 50 MG/ML IJ SOLN: 6.2500 mg | INTRAMUSCULAR | Status: DC | PRN

## 2012-01-08 MED ORDER — UNJURY CHOCOLATE CLASSIC POWDER
2.0000 [oz_av] | Freq: Four times a day (QID) | ORAL | Status: DC
  Administered 2012-01-09: 2 [oz_av] via ORAL

## 2012-01-08 MED ORDER — PROGESTERONE MICRONIZED 100 MG PO CAPS
100.0000 mg | ORAL_CAPSULE | Freq: Every day | ORAL | Status: DC
  Filled 2012-01-08 (×2): qty 1

## 2012-01-08 MED ORDER — LIDOCAINE HCL (CARDIAC) 20 MG/ML IV SOLN
INTRAVENOUS | Status: DC | PRN
  Administered 2012-01-08: 100 mg via INTRAVENOUS

## 2012-01-08 MED ORDER — SUFENTANIL CITRATE 50 MCG/ML IV SOLN
INTRAVENOUS | Status: DC | PRN
  Administered 2012-01-08 (×2): 10 ug via INTRAVENOUS
  Administered 2012-01-08: 20 ug via INTRAVENOUS
  Administered 2012-01-08: 10 ug via INTRAVENOUS

## 2012-01-08 MED ORDER — HYDROMORPHONE HCL PF 1 MG/ML IJ SOLN
0.2500 mg | INTRAMUSCULAR | Status: DC | PRN
  Administered 2012-01-08 (×2): 0.25 mg via INTRAVENOUS
  Administered 2012-01-08: 0.5 mg via INTRAVENOUS
  Administered 2012-01-08 (×2): 0.25 mg via INTRAVENOUS
  Administered 2012-01-08: 0.5 mg via INTRAVENOUS

## 2012-01-08 MED ORDER — SCOPOLAMINE 1 MG/3DAYS TD PT72
MEDICATED_PATCH | TRANSDERMAL | Status: DC | PRN
  Administered 2012-01-08: 1 via TRANSDERMAL

## 2012-01-08 MED ORDER — BUPIVACAINE LIPOSOME 1.3 % IJ SUSP
20.0000 mL | Freq: Once | INTRAMUSCULAR | Status: DC
  Filled 2012-01-08: qty 20

## 2012-01-08 MED ORDER — HYDROCORTISONE ACE-PRAMOXINE 2.5-1 % RE CREA
TOPICAL_CREAM | RECTAL | Status: DC | PRN
  Filled 2012-01-08: qty 30

## 2012-01-08 MED ORDER — LACTATED RINGERS IR SOLN
Status: DC | PRN
  Administered 2012-01-08: 1000 mL

## 2012-01-08 MED ORDER — ACETAMINOPHEN 10 MG/ML IV SOLN
INTRAVENOUS | Status: DC | PRN
  Administered 2012-01-08: 1000 mg via INTRAVENOUS

## 2012-01-08 MED ORDER — LACTATED RINGERS IV SOLN: INTRAVENOUS | Status: DC

## 2012-01-08 MED ORDER — OXYCODONE-ACETAMINOPHEN 5-325 MG/5ML PO SOLN: 5.0000 mL | ORAL | Status: DC | PRN

## 2012-01-08 MED ORDER — PROMETHAZINE HCL 25 MG/ML IJ SOLN: 6.2500 mg | INTRAMUSCULAR | Status: DC | PRN

## 2012-01-08 MED ORDER — SODIUM CHLORIDE 0.9 % IJ SOLN
INTRAMUSCULAR | Status: DC | PRN
  Administered 2012-01-08: 10 mL

## 2012-01-08 MED ORDER — HEPARIN SODIUM (PORCINE) 5000 UNIT/ML IJ SOLN
INTRAMUSCULAR | Status: AC
  Filled 2012-01-08: qty 1

## 2012-01-08 MED ORDER — CISATRACURIUM BESYLATE 2 MG/ML IV SOLN
INTRAVENOUS | Status: DC | PRN
  Administered 2012-01-08 (×2): 2 mg via INTRAVENOUS
  Administered 2012-01-08: 10 mg via INTRAVENOUS

## 2012-01-08 MED ORDER — GLYCOPYRROLATE 0.2 MG/ML IJ SOLN
INTRAMUSCULAR | Status: DC | PRN
  Administered 2012-01-08: .6 mg via INTRAVENOUS

## 2012-01-08 MED ORDER — PROPOFOL 10 MG/ML IV EMUL
INTRAVENOUS | Status: DC | PRN
  Administered 2012-01-08: 200 mg via INTRAVENOUS

## 2012-01-08 MED ORDER — EPHEDRINE SULFATE 50 MG/ML IJ SOLN
INTRAMUSCULAR | Status: DC | PRN
  Administered 2012-01-08: 10 mg via INTRAVENOUS

## 2012-01-08 MED ORDER — DEXAMETHASONE SODIUM PHOSPHATE 4 MG/ML IJ SOLN
INTRAMUSCULAR | Status: DC | PRN
  Administered 2012-01-08: 10 mg via INTRAVENOUS

## 2012-01-08 MED ORDER — CEFOXITIN SODIUM-DEXTROSE 1-4 GM-% IV SOLR (PREMIX)
INTRAVENOUS | Status: AC
  Filled 2012-01-08: qty 100

## 2012-01-08 MED ORDER — THYROID 30 MG PO TABS
75.0000 mg | ORAL_TABLET | Freq: Every day | ORAL | Status: DC
  Filled 2012-01-08 (×2): qty 1

## 2012-01-08 MED ORDER — HEPARIN SODIUM (PORCINE) 5000 UNIT/ML IJ SOLN
5000.0000 [IU] | INTRAMUSCULAR | Status: AC
  Administered 2012-01-08: 5000 [IU] via SUBCUTANEOUS

## 2012-01-08 MED ORDER — MIDAZOLAM HCL 5 MG/5ML IJ SOLN
INTRAMUSCULAR | Status: DC | PRN
  Administered 2012-01-08: 2 mg via INTRAVENOUS

## 2012-01-08 MED ORDER — KCL IN DEXTROSE-NACL 20-5-0.45 MEQ/L-%-% IV SOLN
INTRAVENOUS | Status: DC
  Administered 2012-01-08: 1000 mL via INTRAVENOUS
  Filled 2012-01-08 (×4): qty 1000

## 2012-01-08 MED ORDER — ACETAMINOPHEN 10 MG/ML IV SOLN
INTRAVENOUS | Status: AC
  Filled 2012-01-08: qty 100

## 2012-01-08 MED ORDER — HYDROMORPHONE HCL PF 1 MG/ML IJ SOLN
INTRAMUSCULAR | Status: AC
  Filled 2012-01-08: qty 1

## 2012-01-08 MED ORDER — ONDANSETRON HCL 4 MG/2ML IJ SOLN: 4.0000 mg | INTRAMUSCULAR | Status: DC | PRN

## 2012-01-08 MED ORDER — LACTATED RINGERS IV SOLN
INTRAVENOUS | Status: DC
  Administered 2012-01-08: 1000 mL via INTRAVENOUS
  Administered 2012-01-08: 12:00:00 via INTRAVENOUS

## 2012-01-08 MED ORDER — ONDANSETRON HCL 4 MG/2ML IJ SOLN
INTRAMUSCULAR | Status: DC | PRN
  Administered 2012-01-08: 4 mg via INTRAVENOUS

## 2012-01-08 MED ORDER — CEFAZOLIN SODIUM 1-5 GM-% IV SOLN
INTRAVENOUS | Status: AC
  Filled 2012-01-08: qty 100

## 2012-01-08 MED ORDER — DROPERIDOL 2.5 MG/ML IJ SOLN
INTRAMUSCULAR | Status: DC | PRN
  Administered 2012-01-08: 0.625 mg via INTRAVENOUS

## 2012-01-08 MED ORDER — HYDROMORPHONE HCL PF 1 MG/ML IJ SOLN
INTRAMUSCULAR | Status: AC
  Administered 2012-01-08: 0.25 mg via INTRAVENOUS
  Filled 2012-01-08: qty 1

## 2012-01-08 MED ORDER — SCOPOLAMINE 1 MG/3DAYS TD PT72
MEDICATED_PATCH | TRANSDERMAL | Status: AC
  Filled 2012-01-08: qty 1

## 2012-01-08 MED ORDER — ACETAMINOPHEN 160 MG/5ML PO SOLN: 650.0000 mg | ORAL | Status: DC | PRN

## 2012-01-08 MED ORDER — HEPARIN SODIUM (PORCINE) 5000 UNIT/ML IJ SOLN
5000.0000 [IU] | Freq: Three times a day (TID) | INTRAMUSCULAR | Status: DC
  Administered 2012-01-08 – 2012-01-09 (×2): 5000 [IU] via SUBCUTANEOUS
  Filled 2012-01-08 (×5): qty 1

## 2012-01-08 MED ORDER — SUCCINYLCHOLINE CHLORIDE 20 MG/ML IJ SOLN
INTRAMUSCULAR | Status: DC | PRN
  Administered 2012-01-08: 100 mg via INTRAVENOUS

## 2012-01-08 MED ORDER — UNJURY VANILLA POWDER: 2.0000 [oz_av] | Freq: Four times a day (QID) | ORAL | Status: DC

## 2012-01-08 MED ORDER — UNJURY CHICKEN SOUP POWDER: 2.0000 [oz_av] | Freq: Four times a day (QID) | ORAL | Status: DC

## 2012-01-08 MED ORDER — BUPIVACAINE LIPOSOME 1.3 % IJ SUSP
INTRAMUSCULAR | Status: DC | PRN
  Administered 2012-01-08: 20 mL

## 2012-01-08 MED ORDER — KCL IN DEXTROSE-NACL 20-5-0.45 MEQ/L-%-% IV SOLN
INTRAVENOUS | Status: AC
  Administered 2012-01-08: 1000 mL via INTRAVENOUS
  Filled 2012-01-08: qty 1000

## 2012-01-08 MED ORDER — MORPHINE SULFATE 2 MG/ML IJ SOLN
2.0000 mg | INTRAMUSCULAR | Status: DC | PRN
  Administered 2012-01-08: 2 mg via INTRAVENOUS
  Administered 2012-01-08 – 2012-01-09 (×3): 4 mg via INTRAVENOUS
  Filled 2012-01-08: qty 2
  Filled 2012-01-08: qty 1
  Filled 2012-01-08 (×2): qty 2

## 2012-01-08 SURGICAL SUPPLY — 61 items
APL SKNCLS STERI-STRIP NONHPOA (GAUZE/BANDAGES/DRESSINGS) ×1
BAND LAP VG SYSTEM W/TUBES (Band) ×1 IMPLANT
BENZOIN TINCTURE PRP APPL 2/3 (GAUZE/BANDAGES/DRESSINGS) ×2 IMPLANT
BLADE HEX COATED 2.75 (ELECTRODE) ×2 IMPLANT
BLADE SURG 15 STRL LF DISP TIS (BLADE) ×1 IMPLANT
BLADE SURG 15 STRL SS (BLADE) ×2
CANISTER SUCTION 2500CC (MISCELLANEOUS) ×2 IMPLANT
CLOTH BEACON ORANGE TIMEOUT ST (SAFETY) ×2 IMPLANT
COVER SURGICAL LIGHT HANDLE (MISCELLANEOUS) ×2 IMPLANT
DECANTER SPIKE VIAL GLASS SM (MISCELLANEOUS) ×4 IMPLANT
DEVICE SUT QUICK LOAD TK 5 (STAPLE) ×6 IMPLANT
DEVICE SUT TI-KNOT TK 5X26 (MISCELLANEOUS) ×8 IMPLANT
DEVICE SUTURE ENDOST 10MM (ENDOMECHANICALS) ×4 IMPLANT
DISSECTOR BLUNT TIP ENDO 5MM (MISCELLANEOUS) ×2 IMPLANT
DRAPE CAMERA CLOSED 9X96 (DRAPES) ×2 IMPLANT
ELECT REM PT RETURN 9FT ADLT (ELECTROSURGICAL) ×2
ELECTRODE REM PT RTRN 9FT ADLT (ELECTROSURGICAL) ×1 IMPLANT
GLOVE BIOGEL M 8.0 STRL (GLOVE) ×2 IMPLANT
GLOVE BIOGEL PI IND STRL 7.0 (GLOVE) ×1 IMPLANT
GLOVE BIOGEL PI INDICATOR 7.0 (GLOVE) ×1
GOWN STRL NON-REIN LRG LVL3 (GOWN DISPOSABLE) ×2 IMPLANT
GOWN STRL REIN XL XLG (GOWN DISPOSABLE) ×4 IMPLANT
HOVERMATT SINGLE USE (MISCELLANEOUS) ×2 IMPLANT
KIT BASIN OR (CUSTOM PROCEDURE TRAY) ×2 IMPLANT
LAP BAND VG SYSTEM W/TUBES (Band) ×2 IMPLANT
MESH HERNIA 1X4 RECT BARD (Mesh General) ×1 IMPLANT
MESH HERNIA BARD 1X4 (Mesh General) ×1 IMPLANT
NEEDLE SPNL 22GX3.5 QUINCKE BK (NEEDLE) ×2 IMPLANT
NS IRRIG 1000ML POUR BTL (IV SOLUTION) ×2 IMPLANT
PACK UNIVERSAL I (CUSTOM PROCEDURE TRAY) ×2 IMPLANT
PENCIL BUTTON HOLSTER BLD 10FT (ELECTRODE) ×2 IMPLANT
SCALPEL HARMONIC ACE (MISCELLANEOUS) ×2 IMPLANT
SET IRRIG TUBING LAPAROSCOPIC (IRRIGATION / IRRIGATOR) ×2 IMPLANT
SLEEVE ADV FIXATION 5X100MM (TROCAR) IMPLANT
SLEEVE Z-THREAD 5X100MM (TROCAR) IMPLANT
SOLUTION ANTI FOG 6CC (MISCELLANEOUS) ×2 IMPLANT
SPONGE GAUZE 4X4 12PLY (GAUZE/BANDAGES/DRESSINGS) ×2 IMPLANT
SPONGE LAP 18X18 X RAY DECT (DISPOSABLE) ×2 IMPLANT
STAPLER VISISTAT 35W (STAPLE) ×2 IMPLANT
STRIP CLOSURE SKIN 1/2X4 (GAUZE/BANDAGES/DRESSINGS) IMPLANT
SUT ETHIBOND 2 0 SH (SUTURE) ×6
SUT ETHIBOND 2 0 SH 36X2 (SUTURE) ×3 IMPLANT
SUT PROLENE 2 0 CT2 30 (SUTURE) ×2 IMPLANT
SUT SILK 0 (SUTURE)
SUT SILK 0 30XBRD TIE 6 (SUTURE) IMPLANT
SUT SURGIDAC NAB ES-9 0 48 120 (SUTURE) IMPLANT
SUT VIC AB 2-0 SH 27 (SUTURE)
SUT VIC AB 2-0 SH 27X BRD (SUTURE) IMPLANT
SUT VIC AB 4-0 SH 18 (SUTURE) ×2 IMPLANT
SYR 20CC LL (SYRINGE) ×2 IMPLANT
SYR 30ML LL (SYRINGE) ×2 IMPLANT
SYS KII OPTICAL ACCESS 15MM (TROCAR) ×2
SYSTEM KII OPTICAL ACCESS 15MM (TROCAR) ×1 IMPLANT
TOWEL OR 17X26 10 PK STRL BLUE (TOWEL DISPOSABLE) ×4 IMPLANT
TROCAR ADV FIXATION 11X100MM (TROCAR) ×2 IMPLANT
TROCAR XCEL NON-BLD 11X100MML (ENDOMECHANICALS) ×2 IMPLANT
TROCAR Z-THREAD FIOS 11X100 BL (TROCAR) IMPLANT
TROCAR Z-THREAD FIOS 5X100MM (TROCAR) ×4 IMPLANT
TROCAR Z-THREAD SLEEVE 11X100 (TROCAR) ×2 IMPLANT
TUBE CALIBRATION LAPBAND (TUBING) ×2 IMPLANT
TUBING INSUFFLATION 10FT LAP (TUBING) ×2 IMPLANT

## 2012-01-08 NOTE — Anesthesia Procedure Notes (Signed)
Procedure Name: Intubation Date/Time: 01/08/2012 11:02 AM Performed by: Lurlean Leyden, Lorilee Cafarella L. Patient Re-evaluated:Patient Re-evaluated prior to inductionOxygen Delivery Method: Circle system utilized Preoxygenation: Pre-oxygenation with 100% oxygen Intubation Type: IV induction, Cricoid Pressure applied and Rapid sequence Laryngoscope Size: Miller and 3 Grade View: Grade I Tube size: 7.5 mm Number of attempts: 1 Airway Equipment and Method: Stylet Placement Confirmation: ETT inserted through vocal cords under direct vision,  breath sounds checked- equal and bilateral and positive ETCO2 Secured at: 22 cm Tube secured with: Tape Dental Injury: Teeth and Oropharynx as per pre-operative assessment

## 2012-01-08 NOTE — Transfer of Care (Signed)
Immediate Anesthesia Transfer of Care Note  Patient: Alicia Lawson  Procedure(s) Performed: Procedure(s) (LRB): LAPAROSCOPIC GASTRIC BANDING (N/A)  Patient Location: PACU  Anesthesia Type: General  Level of Consciousness: awake, alert  and oriented  Airway & Oxygen Therapy: Patient Spontanous Breathing and Patient connected to face mask oxygen  Post-op Assessment: Report given to PACU RN and Post -op Vital signs reviewed and stable  Post vital signs: Reviewed and stable  Complications: No apparent anesthesia complications

## 2012-01-08 NOTE — Anesthesia Preprocedure Evaluation (Signed)
Anesthesia Evaluation  Patient identified by MRN, date of birth, ID band Patient awake    Reviewed: Allergy & Precautions, H&P , NPO status , Patient's Chart, lab work & pertinent test results  History of Anesthesia Complications (+) PONV  Airway Mallampati: II TM Distance: >3 FB Neck ROM: Full    Dental No notable dental hx.    Pulmonary neg pulmonary ROS, sleep apnea and Continuous Positive Airway Pressure Ventilation ,  breath sounds clear to auscultation  Pulmonary exam normal       Cardiovascular hypertension, negative cardio ROS  Rhythm:Regular Rate:Normal     Neuro/Psych  Headaches, negative neurological ROS  negative psych ROS   GI/Hepatic negative GI ROS, Neg liver ROS, hiatal hernia, GERD-  ,  Endo/Other  negative endocrine ROSDiabetes mellitus-Hypothyroidism Morbid obesity  Renal/GU negative Renal ROS  negative genitourinary   Musculoskeletal negative musculoskeletal ROS (+)   Abdominal   Peds negative pediatric ROS (+)  Hematology negative hematology ROS (+)   Anesthesia Other Findings   Reproductive/Obstetrics negative OB ROS                           Anesthesia Physical Anesthesia Plan  ASA: III  Anesthesia Plan: General   Post-op Pain Management:    Induction: Intravenous  Airway Management Planned:   Additional Equipment:   Intra-op Plan:   Post-operative Plan: Extubation in OR  Informed Consent: I have reviewed the patients History and Physical, chart, labs and discussed the procedure including the risks, benefits and alternatives for the proposed anesthesia with the patient or authorized representative who has indicated his/her understanding and acceptance.   Dental advisory given  Plan Discussed with: CRNA  Anesthesia Plan Comments:         Anesthesia Quick Evaluation

## 2012-01-08 NOTE — Preoperative (Signed)
Beta Blockers   Reason not to administer Beta Blockers:Not Applicable 

## 2012-01-08 NOTE — Anesthesia Postprocedure Evaluation (Signed)
  Anesthesia Post-op Note  Patient: Alicia Lawson  Procedure(s) Performed: Procedure(s) (LRB): LAPAROSCOPIC GASTRIC BANDING (N/A)  Patient Location: PACU  Anesthesia Type: General  Level of Consciousness: awake and alert   Airway and Oxygen Therapy: Patient Spontanous Breathing  Post-op Pain: mild  Post-op Assessment: Post-op Vital signs reviewed, Patient's Cardiovascular Status Stable, Respiratory Function Stable, Patent Airway and No signs of Nausea or vomiting  Post-op Vital Signs: stable  Complications: No apparent anesthesia complications

## 2012-01-08 NOTE — H&P (Addendum)
Chief Complaint: BMI of 50 with desire for lap band and hiatal hernia repair  History of Present Illness: Alicia Lawson is an 51 y.o. female who was initially referred to me for her gastroesophageal reflux disease. However she has a hiatal hernia with significant GERD. We have her approved for lapband hiatal hernia repair. She has signed our operative permit and have answered her questions. Given her oral bowel prep and she notices failed a low carb diet preoperatively. Her lab and is scheduled for March 5.  Past Medical History   Diagnosis  Date   .  Diverticulosis of colon (without mention of hemorrhage)    .  Esophageal reflux    .  Family history of malignant neoplasm of gastrointestinal tract    .  Personal history of colonic polyps  08/2007     hyperplastic   .  Depression    .  HTN (hypertension)    .  Obesity    .  Kidney stones    .  Hypothyroidism      hashimoto   .  Diverticulosis of colon (without mention of hemorrhage)    .  Anemia    .  Hyperlipidemia    .  Multiple thyroid nodules  October 2012   .  Arthritis    .  DM (diabetes mellitus)      Pt believes she is Pre-Diabetic   .  Hiatal hernia     Past Surgical History   Procedure  Date   .  Abdominal hysterectomy    .  Umbilical hernia repair  1990   .  Bunionectomy      left   .  Wisdom tooth extraction    .  Oophorectomy  2001    Current Outpatient Prescriptions   Medication  Sig  Dispense  Refill   .  AMBULATORY NON FORMULARY MEDICATION  Estiol cream 2.5 use topically Mon-Fri     .  Ascorbic Acid (VITAMIN C) 1000 MG tablet  Take 1,000 mg by mouth 3 (three) times daily.     .  Cholecalciferol (VITAMIN D) 1000 UNITS capsule  Take 2-3 by mouth daily     .  clobetasol (TEMOVATE) 0.05 % ointment  Apply topically as needed.     .  Diclofenac-Misoprostol (ARTHROTEC PO)  Take by mouth daily.     .  ferrous fumarate-iron polysaccharide complex (TANDEM) 162-115.2 MG CAPS  Take 1 capsule by mouth daily with breakfast.   30 capsule  6   .  fluticasone (FLONASE) 50 MCG/ACT nasal spray      .  losartan (COZAAR) 50 MG tablet      .  Multiple Vitamin (MULTIVITAMIN) tablet  Take 1 tablet by mouth daily.     .  Multiple Vitamins-Minerals (VISION VITAMINS) TABS  Take by mouth 1 day or 1 dose.     .  Omega-3 Fatty Acids (FISH OIL) 1200 MG CAPS  Take 3-6 by mouth daily     .  omeprazole (PRILOSEC) 40 MG capsule  Take 40 mg by mouth 2 (two) times daily.     .  Probiotic Product (PROBIOTIC FORMULA PO)  Take 50 mg by mouth.     .  progesterone (PROMETRIUM) 100 MG capsule  Take 100 mg by mouth daily.     .  solifenacin (VESICARE) 5 MG tablet  Take 10 mg by mouth daily.     Marland Kitchen  thyroid (ARMOUR THYROID) 60 MG tablet  Take 60 mg by  mouth daily.     .  traZODone (DESYREL) 50 MG tablet  daily.      Current Facility-Administered Medications   Medication  Dose  Route  Frequency  Provider  Last Rate  Last Dose   .  0.9 % sodium chloride infusion  500 mL  Intravenous  Continuous  Sheryn Bison, MD     Review of patient's allergies indicates no known allergies.  Family History   Problem  Relation  Age of Onset   .  Thyroid disease  Father    .  Heart defect  Father    .  Cancer  Father       bladder and lung    .  Macular degeneration  Father    .  Lung cancer  Father    .  Ovarian cancer  Mother    .  Hypertension  Mother    .  Cancer  Mother       ovarian    .  Colon cancer  Paternal Grandmother    .  Cancer  Paternal Grandmother       colon    .  Cervical cancer  Maternal Grandmother    .  Cancer  Maternal Grandmother       cervical    .  Colon cancer  Paternal Aunt    .  Cancer  Paternal Aunt       breast    .  Breast cancer  Paternal Aunt    .  Diabetes        grandparents    .  Esophageal cancer  Neg Hx    .  Colon cancer  Cousin    Social History: reports that she quit smoking about 28 years ago. Her smoking use included Cigarettes. She has never used smokeless tobacco. She reports that she  drinks alcohol. She reports that she does not use illicit drugs.  REVIEW OF SYSTEMS - PERTINENT POSITIVES ONLY:  Is positive for loss of sleep and fatigue, has she monos thyroiditis incontinence, headaches, anemia, hoarseness and sore throat, joint pain arthritis. She is divorced and has 3 children. She doesn't smoke rarely drinks.  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 rate 18, height 5' 5.5" (1.664 m), weight 308 lb 8 oz (139.935 kg).  Body mass index is 50.56 kg/(m^2).  Gen: WDWN white female NAD  Neurological: Alert and oriented to person, place, and time. Motor and sensory function is grossly intact  Head: Normocephalic and atraumatic.  Eyes: Conjunctivae are normal. Pupils are equal, round, and reactive to light. No scleral icterus.  Neck: Normal range of motion. Neck supple. No tracheal deviation or thyromegaly present.  Cardiovascular: SR without murmurs or gallops. No carotid bruits  Respiratory: Effort normal. No respiratory distress. No chest wall tenderness. Breath sounds normal. No wheezes, rales or rhonchi.  Abdomen: nontender  GU:  Musculoskeletal: Normal range of motion. Extremities are nontender. No cyanosis, edema or clubbing noted Lymphadenopathy: No cervical, preauricular, postauricular or axillary adenopathy is present Skin: Skin is warm and dry. No rash noted. No diaphoresis. No erythema. No pallor. Pscyh: Normal mood and affect. Behavior is normal. Judgment and thought content normal.  LABORATORY RESULTS:  No results found for this or any previous visit (from the past 48 hour(s)).  RADIOLOGY RESULTS:  No results found.  Problem List:  Patient Active Problem List   Diagnoses   .  Rectal bleeding   .  Hemorrhoids   .  Abdominal pain   .  GERD (gastroesophageal reflux disease)   .  Hemorrhage of rectum and anus     .  Unspecified hemorrhoids without mention of complication   .  Abdominal pain, unspecified site   .  Diverticulosis of colon (without mention of hemorrhage)   .  External hemorrhoids   .  Hiatal hernia   .  Obesity   .  H/O: hypothyroidism   Assessment & Plan:  Symptomatic GERD and morbid obesity. Hiatal hernia on upper GI plan laparoscopic  gastric band and hiatal hernia repair.  Matt B. Daphine Deutscher, MD, [facility]  [phone] beeper  [phone]  12/28/2011 4:29 PM   There has been no change in the patient's past medical history or physical exam in the past 24 hours to the best of my knowledge. I examined the patient in the holding area and have made any changes to the history and physical exam report that is included above.   Expectations and outcome results have been discussed with the patient to include risks and benefits.  All questions have been answered and we will proceed with previously discussed procedure noted and signed in the consent form in the patient's record.    Janeisha Ryle BMD FACS 10:41 AM  01/08/2012

## 2012-01-08 NOTE — Op Note (Signed)
08 January 2012 Surgeon: Wenda Low, MD, FACS Asst:  Jaclynn Guarneri, M.D., [facility]  Procedure: Laparoscopic adjustable gastric banding with 2 sutures posterior repair of hiatal hernia, resection of fat pad, placement of an APL band and subcuticular port  Anes:  General  EBL:  Minimal  Description of Procedure  The patient was taken to OR # 1 and given general anesthesia.  After a prep with PCMX the patient was draped and a timeout performed.  Access to the abdomen was achieved with a 0 degree Optiview technique through the left upper quadrant.    Adhesions were minimal.  Ports were placed to the the right of the midline including a 15 trocar in  the right upper quadrant placed obliquely.  The Satira Mccallum was used to retract the left lateral segment and the peritoneum was incised along the left crus. I completed the dissection of the foregut tightened down the reflection onto the esophagogastric junction. She had a marked amount of foregut fat.  I mobilized this out of the hiatus and found the right and left crus and closed this with 2 sutures posteriorly using Ty knots. We then used the balloon tubing inflated with 10 cc of air to pull back and was it was restrained at the now tightened hiatus. The pars flaccida technique was utilized to insert the blunt "finger " dissector from right to left behind the stomach.  This passed easily and a APL band was picked and prepared and brought in the abdomen. The brought around behind the stomach and buckled.  The foregut fat was taken off the anterior stomach and a 4 suture plication was used with purchases up on the esophagogastric region.  The lapband APL  Had been previously flushed and was inserted through the 15 trocar.  It was placed in the blunt dissector tip and pulled around behind the stomach.   The EJ junction as assessed for a hiatus hernia and closed as mentioned above..  The band was plicated with 4 sutures as mentioned above..  The tubing was brought  out through the lower incision on the right and connected to the port which had mesh sewn onto the back and was placed into the subcutaneous pocket.  The wounds were injected with Exparel and closed with 4-0 Vicryl with benzoin Steri-Strips on the skin. The patient was taken to the PACU in stable condition.    Matt B. Daphine Deutscher, MD, [facility], [place] [phone]

## 2012-01-09 ENCOUNTER — Inpatient Hospital Stay (HOSPITAL_COMMUNITY): Payer: 59

## 2012-01-09 DIAGNOSIS — Z09 Encounter for follow-up examination after completed treatment for conditions other than malignant neoplasm: Secondary | ICD-10-CM

## 2012-01-09 LAB — HEMOGLOBIN AND HEMATOCRIT, BLOOD: Hemoglobin: 12.6 g/dL (ref 12.0–15.0)

## 2012-01-09 LAB — DIFFERENTIAL
Basophils Absolute: 0 10*3/uL (ref 0.0–0.1)
Lymphocytes Relative: 10 % — ABNORMAL LOW (ref 12–46)
Neutro Abs: 8.4 10*3/uL — ABNORMAL HIGH (ref 1.7–7.7)
Neutrophils Relative %: 85 % — ABNORMAL HIGH (ref 43–77)

## 2012-01-09 LAB — CBC
Platelets: 281 10*3/uL (ref 150–400)
RDW: 13.5 % (ref 11.5–15.5)
WBC: 9.8 10*3/uL (ref 4.0–10.5)

## 2012-01-09 MED ORDER — IOHEXOL 300 MG/ML  SOLN: 50.0000 mL | Freq: Once | INTRAMUSCULAR | Status: DC | PRN

## 2012-01-09 MED ORDER — OXYCODONE-ACETAMINOPHEN 5-325 MG/5ML PO SOLN: 5.0000 mL | ORAL | Status: AC | PRN

## 2012-01-09 NOTE — Progress Notes (Signed)
Pt alert and oriented; just returned from UGI; results called to Dr. Daphine Deutscher and orders received to begin POD #1 diet; [place], RN aware of results and orders; tolerating water well; will advance to protein shake; denies any nausea or vomiting; denies burping or flatus or BM; voiding well; ambulating well in hallways; c/o gas pain and mild abdominal pain with relief from prn pain meds; pt already has follow up appts with [facility] and [facility]; aware of BELT program and support group; discharge instructions reviewed and pt verbalized understanding of; awaiting MD.  ADJUSTABLE GASTRIC BAND DISCHARGE INSTRUCTIONS  Drs. Fredrik Rigger, Hoxworth, Wilson, and Parker Call if you have any problems.   Call [phone] and ask for the surgeon on call.    If you need immediate assistance come to the ER at [facility]. Tell the ER personnel that you are a new post-op gastric banding patient. Signs and symptoms to report:   Severe vomiting or nausea. If you cannot tolerate clear liquids for longer than 1 day, you need to call your surgeon.    Abdominal pain which does not get better after taking your pain medication   Fever greater than 101 F degree   Difficulty breathing   Chest pain    Redness, swelling, drainage, or foul odor at incision sites    If your incisions open or pull apart   Swelling or pain in calf (lower leg)   Diarrhea, frequent watery, uncontrolled bowel movements.   Constipation, (no bowel movements for 3 days) if this occurs, Take Milk of Magnesia, 2 tablespoons by mouth, 3 times a day for 2 days if needed.  Call your doctor if constipation continues. Stop taking Milk of Magnesia once you have had a bowel movement. You may also use Miralax according to the label instructions.   Anything you consider "abnormal for you".   Normal side effects after Surgery:   Unable to sleep at night or concentrate   Irritability   Being tearful (crying) or depressed   These are common complaints, possibly  related to your anesthesia, stress of surgery and change in lifestyle, that usually go away a few weeks after surgery.  If these feelings continue, call your medical doctor.  Wound Care You may have surgical glue, steri-strips, or staples over your incisions after surgery.  Surgical glue:  Looks like a clear film over your incisions and will wear off gradually. Steri-strips: Strips of tape over your incisions. You may notice a yellowish color on the skin underneath the steri-strips. This is a substance used to make the steri-strips stick better. Do not pull the steri-strips off - let them fall off. Staples: Cherlynn Polo may be removed before you leave the hospital. If you go home with staples, call [facility] [phone]) for an appointment with your surgeon's nurse to have staples removed in 7 - 10 days. Showering: You may shower two days after your surgery unless otherwise instructed by your surgeon. Wash gently around wounds with warm soapy water, rinse well, and gently pat dry.  If you have a drain, you may need someone to hold this while you shower. Avoid tub baths until staples are removed and incisions are healed.    Medications   Medications should be liquid or crushed if larger than the size of a dime.  Extended release pills should not be crushed.   Depending on the size and number of medications you take, you may need to stagger/change the time you take your medications so that you  do not over-fill your pouch.    Make sure you follow-up with your primary care physician to make medication adjustments needed during rapid weight loss and life-style adjustment.   If you are diabetic, follow up with the doctor that prescribes your diabetes medication(s) within one week after surgery and check your blood sugar regularly.   Do not drive while taking narcotics!   Do not take acetaminophen (Tylenol) and Roxicet or Lortab Elixir at the same time since these pain medications contain  acetaminophen.  Diet at home: (First 2 Weeks)  You will see the nutritionist two weeks after your surgery. She will advance your diet if you are tolerating liquids well. Once at home, if you have severe vomiting or nausea and cannot tolerate clear liquids lasting longer than 1 day, call your surgeon.  For Same Day Surgery Discharge Patients: The day of surgery drink water only: 2 ounces every 4 hours. If you are tolerating water, begin drinking your high protein shake the next morning. For Overnight Stay Patients: Begin high protein shake 2 ounces every 3 hours, 5 - 6 times per day.  Gradually increase the amount you drink as tolerated.  You may find it easier to slowly sip shakes throughout the day.  It is important to get your proteins in first.   Protein Shake   Drink at least 2 ounces of shake 5-6 times per day   Each serving of protein shakes should have a minimum of 15 grams of protein and no more than 5 grams of carbohydrate    Increase the amount of protein shake you drink as tolerated   Protein powder may be added to fluids such as non-fat milk or Lactaid milk (limit to 20 grams added protein powder per serving   The initial goal is to drink at least 8 ounces of protein shake/drink per day (or as directed by the nutritionist). Some examples of protein shakes are [employer], [employer], EAS Edge HP, and Unjury. Hydration   Gradually increase the amount of water and other liquids as tolerated (See Acceptable Fluids)   Gradually increase the amount of protein shake as tolerated     Sip fluids slowly and throughout the day   May use Sugar substitutes, use sparingly (limit to 6 - 8 packets per day).  Your fluid goal is 64 ounces of fluid daily. It may take a few weeks to build up to this.         32 oz (or more) should be clear liquids and 32 oz (or more) should be full liquids.         Liquids should not contain sugar, caffeine, or carbonation!  Acceptable Fluids Clear  Liquids:   Water or Sugar-free flavored water, Fruit H2O   Decaffeinated coffee or tea (sugar-free)   Crystal Lite, Wyler's Lite, Minute Maid Lite   Sugar-free Jell-O   Bouillon or broth   Sugar-free Popsicle:   *Less than 20 calories each; Limit 1 per day   Full Liquids:              Protein Shakes/Drinks + 2 choices per day of other full liquids shown below.    Other full liquids must be: No more than 12 grams of Carbs per serving,  No more than 3 grams of Fat per serving   Strained low-fat cream soup   Non-Fat milk   Fat-free Lactaid Milk   Sugar-free yogurt (Dannon Lite & Fit) Vitamins and Minerals (Start 1 day after surgery  unless otherwise directed)   1 Chewable Multivitamin / Multimineral Supplement (i.e. Centrum for Adults)   Chewable Calcium Citrate with Vitamin D-3. Take 1500 mg each day.           (Example: 3 Chewable Calcium Plus 600 with Vitamin D-3 can be found at [facility])           Do not mix multivitamins containing iron with calcium supplements; take 2 hours   apart   Do not substitute Tums (calcium carbonate) for your calcium   Menstruating women and those at risk for anemia may need extra iron. Talk with your doctor to see if you need additional iron.     If you need extra iron:  Total daily Iron recommendations (including Vitamins) = 50 - 100 mg Iron/day Do not stop taking or change any vitamins or minerals until you talk to your nutritionist or surgeon. Your nutritionist and / or physician must approve all vitamin and mineral supplements. Exercise For maximum success, begin exercising as soon as your doctor recommends. Make sure your physician approves any physical activity.   Depending on fitness level, begin with a simple walking program   Walk 5-15 minutes each day, 7 days per week.    Slowly increase until you are walking 30-45 minutes per day   Consider joining our BELT program. [phone] or email [email or web address] [email or web address] Things to remember:   You may have sexual  relations when you feel comfortable. It is VERY important for female patients to use a reliable birth control method. Fertility often increases after surgery. Do not get pregnant for at least 18 months.   It is very important to keep all follow up appointments with your surgeon, nutritionist, primary care physician, and behavioral health practitioner. After the first year, please follow up with your bariatric surgeon at least once a year in order to maintain best weight loss results.  [facility]: [phone] [facility]: [phone]   Free counseling is available for you and your family through collaboration between [facility] and [facility]. Please call [phone] and leave a message.    Consider purchasing a medical alert bracelet that says you had lap-band surgery.    [facility] has a free Bariatric Surgery Support Group that meets monthly, the 3rd Thursday, 6 pm, Classroom #1, [place]. You may register online at [email or web address], but registration is not necessary. Select Classes and Support Groups, Bariatric Surgery, or Call [phone]   Do not return to work or drive until cleared by your surgeon   Use your CPAP when sleeping if applicable   Do not lift anything greater than ten pounds for at least two weeks.   You will probably have your first fill (fluid added to your band) 6 weeks after surgery  Talmadge Chad, RN Bariatric Nurse coordinator

## 2012-01-09 NOTE — Discharge Instructions (Signed)
Gastric Banding Surgery Care After Refer to this sheet in the next few weeks. These discharge instructions provide you with general information on caring for yourself after you leave the hospital. Your caregiver may also give you specific instructions. Your treatment has been planned according to the most current medical practices available, but unavoidable complications sometimes occur. If you have any problems or questions after discharge, call your caregiver. HOME CARE INSTRUCTIONS  Activity  Take frequent walks throughout the day. This will help to prevent blood clots. Do not sit for longer than 45 minutes to 1 hour while awake for 4 to 6 weeks after surgery.   Continue to do coughing and deep breathing exercises once you get home. This will help to prevent pneumonia.   Do not do strenuous activities, such as heavy lifting, pushing, or pulling, until after your follow-up visit with your caregiver. Do not lift anything heavier than 10 lb (4.5 kg).   Talk with your caregiver about when you may return to work and your exercise routine.   Do not drive while taking prescription pain medicine.  Nutrition  It is very important that you drink at least 80 oz (2,400 mL) of fluid a day.   You should stay on a clear liquid diet until your follow-up visit with your caregiver. Keep sugar-free, clear liquid items on hand, including:   Tea: hot or cold. Drink only decaffeinated for the first month.   Broths: clear beef, chicken, vegetable.   Others: water, sugar-free frozen ice pops, flavored water, gelatin (after 1 week).   Do not consume caffeine for 1 month. Large amounts of caffeine can cause dehydration.   A dietician may also give you specific instructions.   Follow your caregiver's recommendations about vitamins and protein requirements after surgery.  Hygiene  You may shower and wash your hair 2 days after surgery. Pat incisions dry. Do not rub incisions with a washcloth or towel.    Follow your caregiver's recommendations about baths and pools following surgery.  Pain control  If a prescription medicine was given, follow your caregiver's directions.   You may feel some gas pain caused by the carbon dioxide used to inflate your abdomen during surgery. This pain can be felt in your chest, shoulder, back, or abdominal area. Moving around often is advised.  Incision care You may have 4 or more small incisions. They are closed with skin adhesive strips and have a clear plastic covering over them. You may remove your dressings the number of days directed by your caregiver after surgery. Check your incisions and surrounding area daily for any redness, swelling, discoloration, fluid (drainage), or bleeding. Dark red, dried blood may appear under these coverings. This is normal. SEEK MEDICAL CARE IF:   You develop persistent nausea and vomiting.   You have pain and discomfort with swallowing.   You have pain, swelling, or warmth in the lower extremities.   You have an oral temperature above 102 F (38.9 C).   You develop chills.   Your incision sites look red, swollen, or have drainage.   Your stool is black, tarry, or maroon in color.   You are lightheaded when standing.   You have any questions or concerns.  SEEK IMMEDIATE MEDICAL CARE IF:   You have chest pain.   You have severe calf pain or pain not relieved by medicine.   You develop shortness of breath or difficulty breathing.   You feel confused.   You have slurred speech.     You suddenly feel weak.  MAKE SURE YOU:   Understand these instructions.   Will watch your condition.   Will get help right away if you are not doing well or get worse.  Document Released: 06/05/2004 Document Revised: 10/11/2011 Document Reviewed: 03/14/2010 Exit[employer] Patient Information 2012 [employer]. 

## 2012-01-09 NOTE — Progress Notes (Signed)
*  PRELIMINARY RESULTS* Vascular Ultrasound Lower extremity venous duplex has been completed.  Preliminary findings: Bilaterally no evidence of DVT or baker's cyst.  Farrel Demark RDMS 01/09/2012, 8:42 AM

## 2012-01-09 NOTE — Discharge Summary (Signed)
Physician Discharge Summary  Patient ID: Alicia Lawson MRN: [id number] DOB/AGE: 07-17-1961 51 y.o.  Admit date: 01/08/2012 Discharge date: 01/09/2012  Admission Diagnoses:  GERD, morbid obesity  Discharge Diagnoses:  same  Active Problems:  * No active hospital problems. *    Surgery:  Lapband APL with HH repair  Discharged Condition: good  Hospital Course:   Admitted for overnight observation  Consults: none  Significant Diagnostic Studies: UGI    Discharge Exam: Blood pressure 114/76, pulse 67, temperature 97.9 F (36.6 C), temperature source Oral, resp. rate 18, height 5\' 5"  (1.651 m), weight 298 lb 2 oz (135.229 kg), SpO2 90.00%. Wounds OK  Disposition: 01-Home or Self Care  Discharge Orders    Future Appointments: Provider: Department: Dept Phone: Center:   01/22/2012 4:00 PM [facility] [phone] NDM   01/24/2012 9:00 AM Valarie Merino, MD [facility] [phone] None     Future Orders Please Complete By Expires   Diet Carb Modified      Increase activity slowly      Remove dressing in 24 hours        Medication List  As of 01/09/2012 11:27 AM   STOP taking these medications         omeprazole 40 MG capsule         TAKE these medications         AMBULATORY NON FORMULARY MEDICATION   Estiol cream .25 ml use topically Mon-Fri      ARMOUR THYROID 60 MG tablet   Generic drug: thyroid   Take 75 mg by mouth daily.      ARTHROTEC PO   Take 75 mg by mouth daily.      clobetasol ointment 0.05 %   Commonly known as: TEMOVATE   Apply topically as needed.      DHEA PO   Take 1 capsule by mouth every morning.      ferrous fumarate-iron polysaccharide complex 162-115.2 MG Caps   Commonly known as: TANDEM   Take 1 capsule by mouth daily.      Fish Oil 1200 MG Caps   Take 3-6 by mouth daily      fluticasone 50 MCG/ACT nasal spray   Commonly known as: FLONASE   2 sprays daily.      hydrocortisone-pramoxine 2.5-1  % rectal cream   Commonly known as: ANALPRAM-HC   Place rectally as needed.      losartan 50 MG tablet   Commonly known as: COZAAR   100 mg daily.      multivitamin tablet   Take 1 tablet by mouth daily.      oxyCODONE-acetaminophen 5-325 MG/5ML solution   Commonly known as: ROXICET   Take 5-10 mLs by mouth every 4 (four) hours as needed.      PROBIOTIC FORMULA PO   Take 50 mg by mouth daily.      progesterone 100 MG capsule   Commonly known as: PROMETRIUM   Take 100 mg by mouth daily.      solifenacin 5 MG tablet   Commonly known as: VESICARE   Take 10 mg by mouth daily.      traZODone 50 MG tablet   Commonly known as: DESYREL   100 mg daily. Prn sleep      Vision Vitamins Tabs   Take by mouth 1 day or 1 dose.      vitamin C 1000 MG tablet   Take 1,000 mg by mouth 3 (  three) times daily.      Vitamin D 1000 UNITS capsule   10,000 Units daily. Daily           Follow-up Information    Schedule an appointment as soon as possible for a visit with Valarie Merino, MD.   Contact information:   [facility] [address], [address] [place] [address] [phone]          Signed: Valarie Merino 01/09/2012, 11:27 AM

## 2012-01-09 NOTE — Progress Notes (Signed)
Patient is alert and oriented, vital signs are stable, discharge instructions reviewed with patient, prescriptions given and discharge instructions instructions reviewed questions and concerns answered, patient to follow up with MD Rosana Hoes, Shakeerah Gradel N 01-09-12 13:18pm

## 2012-01-11 ENCOUNTER — Telehealth (INDEPENDENT_AMBULATORY_CARE_PROVIDER_SITE_OTHER): Payer: Self-pay

## 2012-01-11 NOTE — Telephone Encounter (Signed)
Patient having a lot of nausea and threw up once last night. She said it starts 1-1.5 hrs after taking pain medicine. She wonders if it is from the narcotics or if it is from the surgery and what can be done to help symptoms. Please advise.

## 2012-01-15 NOTE — Telephone Encounter (Signed)
Pt states that the n/v subsided and she is feeling better. Pt states she took emetrol in conjunction with her Rocicet. She states her appetite has increased still unable to drink protien shakes the thought has her nauseated. I suggest she use a blender and mix it with ice. Patient was told to call back and leave a message if this works.

## 2012-01-22 ENCOUNTER — Encounter: Payer: Self-pay | Admitting: *Deleted

## 2012-01-22 ENCOUNTER — Encounter: Payer: 59 | Attending: Surgery | Admitting: *Deleted

## 2012-01-22 DIAGNOSIS — Z01818 Encounter for other preprocedural examination: Secondary | ICD-10-CM | POA: Insufficient documentation

## 2012-01-22 DIAGNOSIS — Z713 Dietary counseling and surveillance: Secondary | ICD-10-CM | POA: Insufficient documentation

## 2012-01-22 NOTE — Patient Instructions (Signed)
Patient to follow Phase 3A-Soft, High Protein Diet and follow-up at [facility] in 6 weeks for 2 months post-op nutrition visit for diet advancement. 

## 2012-01-22 NOTE — Progress Notes (Addendum)
  Bariatric Class:  Appt start time: 1600 end time:  1700.  2 Week Post-Operative Nutrition Class  Patient was seen on 01/22/2012 for Post-Operative Nutrition education at [facility].   Surgery date: 01/08/12  Surgery type: LAGB  Last weight: 304.5 lbs  Weight today: 292.5 lbs (with 2 lbs removed for clothes added back in) Weight change: 12 lbs Total weight lost: 12 lbs BMI: 47.9 kg/m^2  TANITA  BODY COMP RESULTS  01/22/12     Weight (lbs) 290.5     %Fat 55.4%     FM (lbs) 161.0     FFM (lbs) 129.5     TBW (lbs) 95.0      The following the learning objective met the patient during this course:  Identifies Phase 3A (Soft, High Proteins) Dietary Goals and will begin from 2 weeks post-operatively to 2 months post-operatively   Identifies appropriate sources of fluids and proteins   States protein recommendations and appropriate sources post-operatively  Identifies the need for appropriate texture modifications, mastication, and bite sizes when consuming solids  Identifies appropriate multivitamin and calcium sources post-operatively  Describes the need for physical activity post-operatively and will follow MD recommendations  States when to call healthcare provider regarding medication questions or post-operative complications  Handouts given during class include:  Phase 3A: Soft, High Protein Diet Handout  Band Fill Guidelines Handout  Follow-Up Plan: Patient will follow-up at [facility] in 6 weeks for 2 months post-op nutrition visit for diet advancement per MD.

## 2012-01-24 ENCOUNTER — Encounter (INDEPENDENT_AMBULATORY_CARE_PROVIDER_SITE_OTHER): Payer: Self-pay | Admitting: Surgery

## 2012-01-24 ENCOUNTER — Ambulatory Visit (INDEPENDENT_AMBULATORY_CARE_PROVIDER_SITE_OTHER): Payer: 59 | Admitting: Surgery

## 2012-01-24 ENCOUNTER — Telehealth (INDEPENDENT_AMBULATORY_CARE_PROVIDER_SITE_OTHER): Payer: Self-pay | Admitting: Surgery

## 2012-01-24 VITALS — BP 140/115 | HR 65 | Temp 97.6°F | Resp 18 | Ht 65.5 in | Wt 291.2 lb

## 2012-01-24 DIAGNOSIS — Z9884 Bariatric surgery status: Secondary | ICD-10-CM

## 2012-01-24 NOTE — Progress Notes (Signed)
Alicia Lawson 51 y.o.  Body mass index is 47.72 kg/(m^2).  Patient Active Problem List  Diagnoses  . Rectal bleeding  . Hemorrhoids  . Abdominal pain  . GERD (gastroesophageal reflux disease)  . Hemorrhage of rectum and anus  . Unspecified hemorrhoids without mention of complication  . Abdominal pain, unspecified site  . Diverticulosis of colon (without mention of hemorrhage)  . External hemorrhoids  . Hiatal hernia  . Obesity  . H/O: hypothyroidism    No Known Allergies  Past Surgical History  Procedure Date  . Abdominal hysterectomy   . Umbilical hernia repair 1990  . Bunionectomy     left  . Wisdom tooth extraction   . Oophorectomy 2001   Angelica Chessman., MD, MD No diagnosis found.  Postop visit 2 weeks out from lapband surgery.. She had a lapband APL with hiatal hernia repair on January 08, 2012.  She is on DHEA and I suggested that she talk with her primary about this.  I advised her to restart her Lorsartan for her elevated BP.  She is having some insomnia.  Return 4 weeks Matt B. Daphine Deutscher, MD, [facility] [phone] beeper [phone]  01/24/2012 10:11 AM

## 2012-01-29 NOTE — Telephone Encounter (Signed)
Contacted the patient with appt time 03/07/12 @ 2;:20. The patient also stated she is having some strange sensations for several hours after she eats. Area at mid bra line infront after eating feels very tight, she also states she feels tremendous gas pains between her shoulder blades in her back after she eats. She denies drinking beverages while eating, states she is eating slow, small portions,overly chewing her food  as directed. Would like advise.

## 2012-01-30 ENCOUNTER — Encounter (HOSPITAL_COMMUNITY): Payer: Self-pay | Admitting: Surgery

## 2012-03-04 ENCOUNTER — Encounter: Payer: 59 | Attending: Surgery | Admitting: *Deleted

## 2012-03-04 ENCOUNTER — Encounter: Payer: Self-pay | Admitting: *Deleted

## 2012-03-04 VITALS — Ht 65.5 in | Wt 286.0 lb

## 2012-03-04 DIAGNOSIS — Z713 Dietary counseling and surveillance: Secondary | ICD-10-CM | POA: Insufficient documentation

## 2012-03-04 DIAGNOSIS — Z01818 Encounter for other preprocedural examination: Secondary | ICD-10-CM | POA: Insufficient documentation

## 2012-03-04 DIAGNOSIS — E669 Obesity, unspecified: Secondary | ICD-10-CM

## 2012-03-04 NOTE — Progress Notes (Addendum)
  Follow-up visit:  8 Weeks Post-Operative LAGB Surgery  Medical Nutrition Therapy:  Appt start time: 1230 end time:  1300.  Primary concerns today: post-operative bariatric surgery nutrition management. Alicia Lawson returns today for f/u and further diet advancement.  Reports she has not had a band fill yet and is "starving all the time". Worried that she has lost only 4.5 lbs, though Tanita analysis shows she has lost 9 lbs of fat mass and gained 5 lbs of fat free mass (mainly fluid). No edema noted. Doing well overall and reports no problems. Scheduled for a band fill on 03/08/12.   Surgery date: 01/08/12  Surgery type: LAGB  Last weight: 304.5 lbs  Weight today: 286.0 lbs Weight change: 4.5 lbs Total weight lost: 16.5 lbs BMI: 46.8 kg/m^2  TANITA  BODY COMP RESULTS  01/22/12 03/04/12  Weight (lbs) 290.5 286.0  %Fat 55.4% 53.2%  FM (lbs) 161.0 152.0  FFM (lbs) 129.5 134.0  TBW (lbs) 95.0 98.0   24-hr recall:  B (AM): Yogurt and cottage cheese Snk (AM): None or Atkins bar   L (PM): 5-6 oz of meat and yogurt Snk (PM):  None D (PM): 5-6 oz meat; sometimes adds cheese Snk (PM): SF popsicle/fudge pop  Fluid intake: Distracted at work and forgets to drink;  Water crystal light, decaf tea = 50-65 oz  Estimated total protein intake: 80-100g  Medications: See medication list. Reports not taking several medications. Supplementation: Not taking consistently; working on regimen  Using straws: No Drinking while eating: No Hair loss: no, but reports it "seems thinner" Carbonated beverages: No N/V/D/C: No Dumping syndrome: No Last Lap-Band fill: No fill yet; scheduled for 03/08/12. States she is "starving".  Recent physical activity:  Starting back at gym in May.   Progress Towards Goal(s):  In progress.  Handouts given during visit include:  Phase 3B: High Protein + Non-Starchy Vegetables  Samples Dispensed:   Celebrate Vitamins Vit D3 5000: 10 ea Lot # [id number]; Exp:  01/15  Nutritional Diagnosis:  NI-1.5 Excessive energy intake related to lack of band fill as evidenced by patient report of excessive hunger and dietary recall.    Intervention:  Nutrition education.  Monitoring/Evaluation:  Dietary intake, exercise, lap band fills, and body weight. Follow up in 1 month for 3 month post-op visit.

## 2012-03-04 NOTE — Patient Instructions (Signed)
Goals:  Follow Phase 3B: High Protein + Non-Starchy Vegetables  Eat 3-6 small meals/snacks, every 3-5 hrs  Increase lean protein foods to meet 60-80g goal  Increase fluid intake to 64oz +  Avoid drinking 15 minutes before, during and 30 minutes after eating  Aim for >30 min of physical activity daily 

## 2012-03-07 ENCOUNTER — Ambulatory Visit (INDEPENDENT_AMBULATORY_CARE_PROVIDER_SITE_OTHER): Payer: 59 | Admitting: Surgery

## 2012-03-07 ENCOUNTER — Encounter (INDEPENDENT_AMBULATORY_CARE_PROVIDER_SITE_OTHER): Payer: Self-pay | Admitting: Surgery

## 2012-03-07 VITALS — HR 72 | Temp 96.6°F | Resp 16 | Ht 65.5 in | Wt 288.2 lb

## 2012-03-07 DIAGNOSIS — Z9884 Bariatric surgery status: Secondary | ICD-10-CM | POA: Insufficient documentation

## 2012-03-07 NOTE — Progress Notes (Signed)
Alicia Lawson Body mass index is 47.23 kg/(m^2).  Having regurgitation:  no  Nocturnal reflux?  no  Amount of fill  1.5  Returns for her first postoperative visit and first fill. She had difficulty getting in to see me and waited for 8 weeks instead of 6. I went ahead and added 1.5 cc to her band she is not having any GERD. She was the first patient referred for a lap Nissen that we substituted the hiatal hernia repair with banding to try to treat both problems. I will see her in  4 weeks and we can tweak her band and get her into the green zone

## 2012-03-07 NOTE — Patient Instructions (Signed)

## 2012-04-02 ENCOUNTER — Encounter: Payer: 59 | Attending: Surgery | Admitting: *Deleted

## 2012-04-02 ENCOUNTER — Encounter: Payer: Self-pay | Admitting: *Deleted

## 2012-04-02 VITALS — Ht 65.5 in | Wt 282.0 lb

## 2012-04-02 DIAGNOSIS — E669 Obesity, unspecified: Secondary | ICD-10-CM

## 2012-04-02 DIAGNOSIS — Z713 Dietary counseling and surveillance: Secondary | ICD-10-CM | POA: Insufficient documentation

## 2012-04-02 DIAGNOSIS — Z01818 Encounter for other preprocedural examination: Secondary | ICD-10-CM | POA: Insufficient documentation

## 2012-04-02 NOTE — Patient Instructions (Addendum)
Continue Previous Goals:  Follow Phase 3B: High Protein + Non-Starchy Vegetables  Eat 3-6 small meals/snacks, every 3-5 hrs  Increase lean protein foods to meet 60-80g goal  Increase fluid intake to 64oz +  Avoid drinking 15 minutes before, during and 30 minutes after eating  Aim for >30 min of physical activity daily

## 2012-04-02 NOTE — Progress Notes (Signed)
  Follow-up visit:  11 Weeks Post-Operative LAGB Surgery  Medical Nutrition Therapy:  Appt start time: 1115 end time:  1145.  Primary concerns today:  Post-operative bariatric surgery nutrition management. Alicia Lawson returns today for post-op follow up with a 4 lb weight loss.  She is frustrated and states she continues to eat a full meal without incident. Scheduled to see Dr. Daphine Deutscher this Friday (5/31) for 4 wk f/u from last fill. Currently finishing prednisone treatment.   Surgery date: 01/08/12  Surgery type: LAGB  Last weight: 304.5 lbs  Weight today: 282.0 lbs Weight change: 4.0 lbs Total weight lost: 20.5 lbs BMI: 46.2 kg/m^2  TANITA  BODY COMP RESULTS  01/22/12 03/04/12 04/02/12  Weight (lbs) 290.5 286.0 282.0  FM (lbs) 161.0 152.0 153.5  FFM (lbs) 129.5 134.0 128.5  TBW (lbs) 95.0 98.0 94.0   24-hr recall:  2-3 water bottles thru day B (AM): Protein shake (Atkins) and yogurt (when gets to work) Snk (10:30-11 AM): Nuts (1.5-2 oz) L (PM): 4-5 oz of chicken tenderloins or tuna, cucumber or carrots,  Snk (PM):  None or nuts or Atkins bar D (PM): 4-5 oz chicken or sometimes steak, salad Snk (PM): SF popsicle/fudge pop  Fluid intake: Distraction at work continues - forgets to drink = 45-50 oz  Estimated total protein intake:  80-90g  Medications: See medication list.  Supplementation: Taking more consistently, but still "forgets" often  Using straws: No Drinking while eating: No Hair loss: No, but reports it still "seems thinner" Carbonated beverages: No N/V/D/C: Reports occasional gas; cannot attribute to certain food Dumping syndrome: No Lap-Band fills:  Last fill 03/07/12; f/u with surgeon scheduled for 04/04/12. States she is still in between yellow/green zone. Does not feel full or satisfied after meals.   Recent physical activity: Started back at gym in May; but none last week d/t plantar fasciitis - swims 2-3x/week as well.     Progress Towards Goal(s):  In  progress.  Nutritional Diagnosis:  NI-1.5 Excessive energy intake related to lack of band fill as evidenced by patient report of excessive hunger and dietary recall.    Intervention:  Nutrition education.  Monitoring/Evaluation:  Dietary intake, exercise, lap band fills, and body weight. Follow up in 3 months for 6 month post-op visit.

## 2012-04-04 ENCOUNTER — Ambulatory Visit (INDEPENDENT_AMBULATORY_CARE_PROVIDER_SITE_OTHER): Payer: 59 | Admitting: Surgery

## 2012-04-04 ENCOUNTER — Encounter (INDEPENDENT_AMBULATORY_CARE_PROVIDER_SITE_OTHER): Payer: Self-pay | Admitting: Surgery

## 2012-04-04 VITALS — BP 112/74 | Ht 65.5 in | Wt 285.2 lb

## 2012-04-04 DIAGNOSIS — Z9884 Bariatric surgery status: Secondary | ICD-10-CM

## 2012-04-04 NOTE — Patient Instructions (Signed)

## 2012-04-04 NOTE — Progress Notes (Signed)
Dwaine Gale Body mass index is 46.74 kg/(m^2).  Having regurgitation:  no  Nocturnal reflux?  no  Amount of fill  1 cc  Slow weight loss but 1 cc added.  Will see back in 6 weeks

## 2012-05-15 ENCOUNTER — Ambulatory Visit (INDEPENDENT_AMBULATORY_CARE_PROVIDER_SITE_OTHER): Payer: 59 | Admitting: Surgery

## 2012-05-15 ENCOUNTER — Other Ambulatory Visit: Payer: Self-pay | Admitting: Dermatology

## 2012-05-15 ENCOUNTER — Encounter (INDEPENDENT_AMBULATORY_CARE_PROVIDER_SITE_OTHER): Payer: Self-pay

## 2012-05-15 VITALS — BP 162/96 | HR 60 | Temp 97.6°F | Resp 16 | Ht 65.5 in | Wt 284.4 lb

## 2012-05-15 DIAGNOSIS — Z4651 Encounter for fitting and adjustment of gastric lap band: Secondary | ICD-10-CM

## 2012-05-15 NOTE — Progress Notes (Signed)
  HISTORY: Alicia Lawson is a 51 y.o.female who received an AP-Large lap-band in March 2013 by Dr. Daphine Deutscher. She comes in with persistent hunger and portion sizes. She denies persistent regurgitation or reflux.  VITAL SIGNS: Filed Vitals:   05/15/12 1119  BP: 162/96  Pulse: 60  Temp: 97.6 F (36.4 C)  Resp: 16    PHYSICAL EXAM: Physical exam reveals a very well-appearing 51 y.o.female in no apparent distress Neurologic: Awake, alert, oriented Psych: Bright affect, conversant Respiratory: Breathing even and unlabored. No stridor or wheezing Abdomen: Soft, nontender, nondistended to palpation. Incisions well-healed. No incisional hernias. Port easily palpated. Extremities: Atraumatic, good range of motion.  ASSESMENT: 51 y.o.  female  s/p AP-Large lap-band.   PLAN: The patient's port was accessed with a 20G Huber needle without difficulty. Clear fluid was aspirated and 0.5 mL saline was added to the port. The patient was able to swallow water without difficulty following the procedure and was instructed to take clear liquids for the next 24-48 hours and advance slowly as tolerated.

## 2012-05-15 NOTE — Patient Instructions (Signed)
Take clear liquids tonight. Thin protein shakes are ok to start tomorrow morning. Slowly advance your diet thereafter. Call us if you have persistent vomiting or regurgitation, night cough or reflux symptoms. Return as scheduled or sooner if you notice no changes in hunger/portion sizes.  

## 2012-06-19 ENCOUNTER — Encounter (INDEPENDENT_AMBULATORY_CARE_PROVIDER_SITE_OTHER): Payer: 59

## 2012-07-23 ENCOUNTER — Ambulatory Visit: Payer: 59 | Admitting: *Deleted

## 2012-07-31 ENCOUNTER — Encounter (INDEPENDENT_AMBULATORY_CARE_PROVIDER_SITE_OTHER): Payer: 59

## 2012-09-04 ENCOUNTER — Ambulatory Visit (INDEPENDENT_AMBULATORY_CARE_PROVIDER_SITE_OTHER): Payer: 59 | Admitting: Physician Assistant

## 2012-09-04 ENCOUNTER — Encounter (INDEPENDENT_AMBULATORY_CARE_PROVIDER_SITE_OTHER): Payer: Self-pay

## 2012-09-04 VITALS — BP 142/88 | HR 66 | Temp 97.0°F | Resp 16 | Ht 65.5 in | Wt 296.4 lb

## 2012-09-04 DIAGNOSIS — Z4651 Encounter for fitting and adjustment of gastric lap band: Secondary | ICD-10-CM

## 2012-09-04 NOTE — Patient Instructions (Signed)
Take clear liquids tonight. Thin protein shakes are ok to start tomorrow morning. Slowly advance your diet thereafter. Call us if you have persistent vomiting or regurgitation, night cough or reflux symptoms. Return as scheduled or sooner if you notice no changes in hunger/portion sizes.  

## 2012-09-04 NOTE — Progress Notes (Signed)
  HISTORY: Alicia Lawson is a 51 y.o.female who received an AP-Large lap-band in March 2013 by Dr. Daphine Deutscher. She has gained 12 lbs since her last visit in July. She describes improved hunger and smaller portions the first few weeks following her last fill but progressively she's had increasing hunger and portions. She attributes her low activity level to persistent plantar fascitis which is being managed by her foot surgeon. She denies persistent regurgitation or reflux.  VITAL SIGNS: Filed Vitals:   09/04/12 1214  BP: 142/88  Pulse: 66  Temp: 97 F (36.1 C)  Resp: 16    PHYSICAL EXAM: Physical exam reveals a very well-appearing 51 y.o.female in no apparent distress Neurologic: Awake, alert, oriented Psych: Bright affect, conversant Respiratory: Breathing even and unlabored. No stridor or wheezing Abdomen: Soft, nontender, nondistended to palpation. Incisions well-healed. No incisional hernias. Port easily palpated. Extremities: Atraumatic, good range of motion.  ASSESMENT: 51 y.o.  female  s/p AP-Large lap-band.   PLAN: The patient's port was accessed with a 20G Huber needle without difficulty. Clear fluid was aspirated and 0.5 mL saline was added to the port. The patient was able to swallow water without difficulty following the procedure and was instructed to take clear liquids for the next 24-48 hours and advance slowly as tolerated. I asked her to return in a month without fail as longer gaps in visits/fills can contribute to weight gain (or at least hindered weight loss).

## 2012-09-16 HISTORY — PX: OTHER SURGICAL HISTORY: SHX169

## 2012-10-09 ENCOUNTER — Ambulatory Visit (INDEPENDENT_AMBULATORY_CARE_PROVIDER_SITE_OTHER): Payer: 59 | Admitting: Physician Assistant

## 2012-10-09 ENCOUNTER — Encounter (INDEPENDENT_AMBULATORY_CARE_PROVIDER_SITE_OTHER): Payer: Self-pay

## 2012-10-09 VITALS — BP 138/90 | HR 68 | Temp 97.6°F | Ht 65.5 in | Wt 292.4 lb

## 2012-10-09 DIAGNOSIS — Z4651 Encounter for fitting and adjustment of gastric lap band: Secondary | ICD-10-CM

## 2012-10-09 NOTE — Patient Instructions (Signed)
Take clear liquids tonight. Thin protein shakes are ok to start tomorrow morning. Slowly advance your diet thereafter. Call us if you have persistent vomiting or regurgitation, night cough or reflux symptoms. Return as scheduled or sooner if you notice no changes in hunger/portion sizes.  

## 2012-10-09 NOTE — Progress Notes (Signed)
  HISTORY: Alicia Lawson is a 51 y.o.female who received an AP-Large lap-band in March 2013 by Dr. Daphine Deutscher. She comes in with 4 lbs of weight loss since her last visit. Unfortunately however, she took a fall from a ladder injuring her left shoulder, left thigh and right leg. She's improving gradually. She denies any problems with her band other than eating slightly more than usual. She denies regurgitation or reflux symptoms. She wants a fill today to continue her weight loss. She's also had a productive cough over the past few weeks as she's been recovering from bronchitis. She denies chest pain or shortness of breath or fever.  VITAL SIGNS: Filed Vitals:   10/09/12 1021  BP: 138/90  Pulse: 68  Temp: 97.6 F (36.4 C)    PHYSICAL EXAM: Physical exam reveals a very well-appearing 51 y.o.female in no apparent distress Neurologic: Awake, alert, oriented Psych: Bright affect, conversant Respiratory: Breathing even and unlabored. No stridor or wheezing. Lungs clear to auscultation bilaterally. Abdomen: Soft, nontender, nondistended to palpation. Incisions well-healed. No incisional hernias. Port easily palpated. Extremities: Atraumatic, good range of motion.  ASSESMENT: 51 y.o.  female  s/p AP-Large lap-band.   PLAN: The patient's port was accessed with a 20G Huber needle without difficulty. Clear fluid was aspirated and 0.5 mL saline was added to the port. The patient was able to swallow water without difficulty following the procedure and was instructed to take clear liquids for the next 24-48 hours and advance slowly as tolerated.

## 2012-10-16 ENCOUNTER — Ambulatory Visit (INDEPENDENT_AMBULATORY_CARE_PROVIDER_SITE_OTHER): Payer: 59 | Admitting: Physician Assistant

## 2012-10-16 ENCOUNTER — Encounter (INDEPENDENT_AMBULATORY_CARE_PROVIDER_SITE_OTHER): Payer: Self-pay

## 2012-10-16 VITALS — BP 152/90 | HR 56 | Temp 96.8°F | Ht 65.5 in | Wt 290.0 lb

## 2012-10-16 DIAGNOSIS — Z4651 Encounter for fitting and adjustment of gastric lap band: Secondary | ICD-10-CM

## 2012-10-16 NOTE — Progress Notes (Signed)
  HISTORY: Alicia Lawson is a 51 y.o.female who received an AP-Large lap-band in March 2013 by Dr. Daphine Deutscher. She comes in one week after a fill and is having dysphagia of both solids and liquids.  VITAL SIGNS: Filed Vitals:   10/16/12 1248  BP: 152/90  Pulse: 56  Temp: 96.8 F (36 C)    PHYSICAL EXAM: Physical exam reveals a very well-appearing 51 y.o.female in no apparent distress Neurologic: Awake, alert, oriented Psych: Bright affect, conversant Respiratory: Breathing even and unlabored. No stridor or wheezing Abdomen: Soft, nontender, nondistended to palpation. Incisions well-healed. No incisional hernias. Port easily palpated. Extremities: Atraumatic, good range of motion.  ASSESMENT: 51 y.o.  female  s/p AP-Large lap-band.   PLAN: The patient's port was accessed with a 20G Huber needle without difficulty. Clear fluid was aspirated and 0.5 mL was removed from the band. The patient was able to swallow water without difficulty following the procedure and was instructed to take clear liquids for the next 24-48 hours and advance slowly as tolerated. I asked her to return if her symptoms didn't improve.

## 2012-10-16 NOTE — Patient Instructions (Signed)
Return in January. Focus on good food choices as well as physical activity. Return sooner if you have an increase in hunger, portion sizes or weight. Return also for difficulty swallowing, night cough, reflux.

## 2012-11-06 ENCOUNTER — Encounter (INDEPENDENT_AMBULATORY_CARE_PROVIDER_SITE_OTHER): Payer: 59

## 2012-11-27 ENCOUNTER — Encounter (INDEPENDENT_AMBULATORY_CARE_PROVIDER_SITE_OTHER): Payer: 59

## 2013-07-02 ENCOUNTER — Ambulatory Visit
Admission: RE | Admit: 2013-07-02 | Discharge: 2013-07-02 | Disposition: A | Discharge status: Home / Self Care | Payer: 59 | Source: Ambulatory Visit | Attending: Physician Assistant | Admitting: Physician Assistant

## 2013-07-02 ENCOUNTER — Encounter (INDEPENDENT_AMBULATORY_CARE_PROVIDER_SITE_OTHER): Payer: Self-pay

## 2013-07-02 ENCOUNTER — Ambulatory Visit (INDEPENDENT_AMBULATORY_CARE_PROVIDER_SITE_OTHER): Payer: 59 | Admitting: Physician Assistant

## 2013-07-02 VITALS — BP 132/98 | HR 84 | Resp 16 | Ht 65.5 in | Wt 288.4 lb

## 2013-07-02 DIAGNOSIS — Z4651 Encounter for fitting and adjustment of gastric lap band: Secondary | ICD-10-CM

## 2013-07-02 NOTE — Progress Notes (Signed)
  HISTORY: Alicia Lawson is a 52 y.o.female who received an AP-Large lap-band in March 2013 by Dr. Daphine Deutscher. She comes in with complaints of progressive solid food dysphagia over the past couple of weeks. She is no longer able to tolerate foods that she was able to eat previously. She is also traveling by air to [place] for two weeks and her meals are scheduled.  VITAL SIGNS: Filed Vitals:   07/02/13 1032  BP: 132/98  Pulse: 84  Resp: 16    PHYSICAL EXAM: Physical exam reveals a very well-appearing 52 y.o.female in no apparent distress Neurologic: Awake, alert, oriented Psych: Bright affect, conversant Respiratory: Breathing even and unlabored. No stridor or wheezing Abdomen: Soft, nontender, nondistended to palpation. Incisions well-healed. No incisional hernias. Port easily palpated. Extremities: Atraumatic, good range of motion.  ASSESMENT: 52 y.o.  female  s/p AP-Large lap-band.   PLAN: The patient's port was accessed with a 20G Huber needle without difficulty. Clear fluid was aspirated and 8 mL saline was removed from the port to give a total predicted volume of 0 mL. The patient was advised to concentrate on healthy food choices and to avoid slider foods high in fats and carbohydrates. She said she experienced some epigastric fullness after the fluid was removed. I've scheduled her for a KUB today. We'll have her return in three weeks if everything checks out ok.

## 2013-07-02 NOTE — Patient Instructions (Signed)
Return in three weeks. Focus on good food choices as well as physical activity. Return sooner if you have an increase in hunger, portion sizes or weight. Return also for difficulty swallowing, night cough, reflux.   

## 2013-07-23 ENCOUNTER — Ambulatory Visit (INDEPENDENT_AMBULATORY_CARE_PROVIDER_SITE_OTHER): Payer: 59 | Admitting: Physician Assistant

## 2013-07-23 ENCOUNTER — Encounter (INDEPENDENT_AMBULATORY_CARE_PROVIDER_SITE_OTHER): Payer: Self-pay

## 2013-07-23 VITALS — BP 150/90 | HR 60 | Resp 14 | Ht 65.5 in | Wt 293.0 lb

## 2013-07-23 DIAGNOSIS — Z4651 Encounter for fitting and adjustment of gastric lap band: Secondary | ICD-10-CM

## 2013-07-23 NOTE — Patient Instructions (Signed)

## 2013-07-23 NOTE — Progress Notes (Signed)
  HISTORY: Alicia Lawson is a 52 y.o.female who received an AP-Large lap-band in March 2013 by Dr. Daphine Deutscher. She comes in today with 5 pounds of weight gain as all fluid was removed from her band at her last visit 3 weeks ago. She denies any further regurgitation symptoms however she does have a small amount of reflux. She does have hunger and larger than desired portion sizes as she expected. This does not change her disappointment with the weight gain.Marland Kitchen  VITAL SIGNS: Filed Vitals:   07/23/13 1045  BP: 150/90  Pulse: 60  Resp: 14    PHYSICAL EXAM: Physical exam reveals a very well-appearing 53 y.o.female in no apparent distress Neurologic: Awake, alert, oriented Psych: Bright affect, conversant Respiratory: Breathing even and unlabored. No stridor or wheezing Abdomen: Soft, nontender, nondistended to palpation. Incisions well-healed. No incisional hernias. Port easily palpated. Extremities: Atraumatic, good range of motion.  ASSESMENT: 52 y.o.  female  s/p AP-Large lap-band.   PLAN: The patient's port was accessed with a 20G Huber needle without difficulty. Clear fluid was aspirated and 4 mL saline was added to the port to give a total predicted volume of 5 mL. The patient was able to swallow water without difficulty following the procedure and was instructed to take clear liquids for the next 24-48 hours and advance slowly as tolerated.

## 2013-08-13 ENCOUNTER — Encounter (INDEPENDENT_AMBULATORY_CARE_PROVIDER_SITE_OTHER): Payer: Self-pay | Admitting: Physician Assistant

## 2013-08-13 ENCOUNTER — Ambulatory Visit (INDEPENDENT_AMBULATORY_CARE_PROVIDER_SITE_OTHER): Payer: 59 | Admitting: Physician Assistant

## 2013-08-13 VITALS — BP 140/90 | HR 80 | Resp 14 | Ht 65.5 in | Wt 293.6 lb

## 2013-08-13 DIAGNOSIS — Z4651 Encounter for fitting and adjustment of gastric lap band: Secondary | ICD-10-CM

## 2013-08-13 NOTE — Patient Instructions (Signed)

## 2013-08-13 NOTE — Progress Notes (Signed)
  HISTORY: Alicia Lawson is a 52 y.o.female who received an AP-Large lap-band in March 2013 by Dr. Daphine Deutscher. She comes in with stable weight since her last appointment one month ago. She had a band vacation due to GERD/Obstructive symptoms. She has 5 mL in the band now and says that those symptoms haven't returned. She still has more hunger than desired. She would like a fill today but not to the same volume as before her band vacation.  VITAL SIGNS: Filed Vitals:   08/13/13 1141  BP: 140/90  Pulse: 80  Resp: 14    PHYSICAL EXAM: Physical exam reveals a very well-appearing 52 y.o.female in no apparent distress Neurologic: Awake, alert, oriented Psych: Bright affect, conversant Respiratory: Breathing even and unlabored. No stridor or wheezing Abdomen: Soft, nontender, nondistended to palpation. Incisions well-healed. No incisional hernias. Port easily palpated. Extremities: Atraumatic, good range of motion.  ASSESMENT: 52 y.o.  female  s/p AP-large lap-band.   PLAN: The patient's port was accessed with a 20G Huber needle without difficulty. Clear fluid was aspirated and 1.5 mL saline was added to the port to give a total predicted volume of 6.5 mL. The patient was able to swallow water without difficulty following the procedure and was instructed to take clear liquids for the next 24-48 hours and advance slowly as tolerated.

## 2013-09-10 ENCOUNTER — Encounter (INDEPENDENT_AMBULATORY_CARE_PROVIDER_SITE_OTHER): Payer: 59

## 2013-09-17 ENCOUNTER — Encounter (INDEPENDENT_AMBULATORY_CARE_PROVIDER_SITE_OTHER): Payer: Self-pay

## 2013-09-17 ENCOUNTER — Ambulatory Visit (INDEPENDENT_AMBULATORY_CARE_PROVIDER_SITE_OTHER): Payer: 59 | Admitting: Physician Assistant

## 2013-09-17 VITALS — BP 148/96 | HR 72 | Temp 97.1°F | Resp 16 | Ht 65.5 in | Wt 302.8 lb

## 2013-09-17 DIAGNOSIS — Z4651 Encounter for fitting and adjustment of gastric lap band: Secondary | ICD-10-CM

## 2013-09-17 NOTE — Progress Notes (Signed)
  HISTORY: Alicia Lawson is a 52 y.o.female who received an AP-Large lap-band in March 2013 by Dr. Daphine Deutscher. She was last seen one month ago for a 1.5 mL fill but unfortunately since then she's gained 9 lbs. She reports feeling no real difference since the last fill. She is now at 6.5 mL - she had 8 mL removed due to dysphagia/reflux in August.  VITAL SIGNS: Filed Vitals:   09/17/13 1339  BP: 148/96  Pulse: 72  Temp: 97.1 F (36.2 C)  Resp: 16    PHYSICAL EXAM: Physical exam reveals a very well-appearing 52 y.o.female in no apparent distress Neurologic: Awake, alert, oriented Psych: Bright affect, conversant Respiratory: Breathing even and unlabored. No stridor or wheezing Abdomen: Soft, nontender, nondistended to palpation. Incisions well-healed. No incisional hernias. Port easily palpated. Extremities: Atraumatic, good range of motion.  ASSESMENT: 52 y.o.  female  s/p AP-Large lap-band.   PLAN: The patient's port was accessed with a 20G Huber needle without difficulty. Clear fluid was aspirated and 1 mL saline was added to the port to give a total predicted volume of 7.5 mL. This is 0.5 mL below her highest fill volume. The patient was able to swallow water without difficulty following the procedure and was instructed to take clear liquids for the next 24-48 hours and advance slowly as tolerated. Hopefully this will get her hunger and portion sizes under better control. We'll have her back in about one month.

## 2013-09-17 NOTE — Patient Instructions (Signed)

## 2013-10-03 IMAGING — US US SOFT TISSUE HEAD/NECK
1 series · 14 of 25 positions shown · non-contrast
Comparison: None.

CLINICAL DATA: Goiter.

THYROID ULTRASOUND
TECHNIQUE: Ultrasound examination of the thyroid gland and adjacent
soft tissues was performed.

[Series 1: us soft tissue head/neck · 0.07mm/px · 14 of 51 slices shown]
[im 1/51]
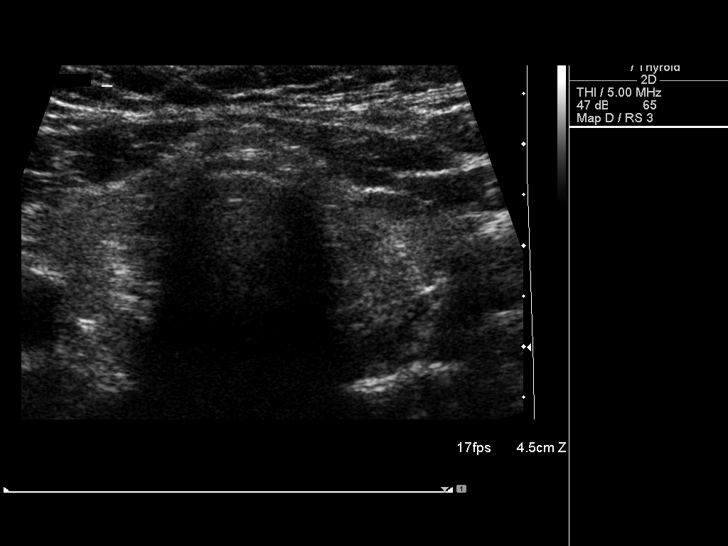
[im 5/51]
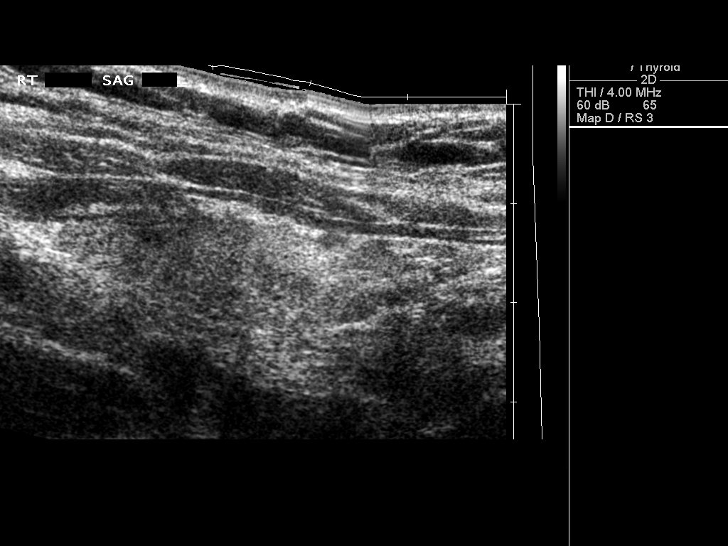
[im 9/51]
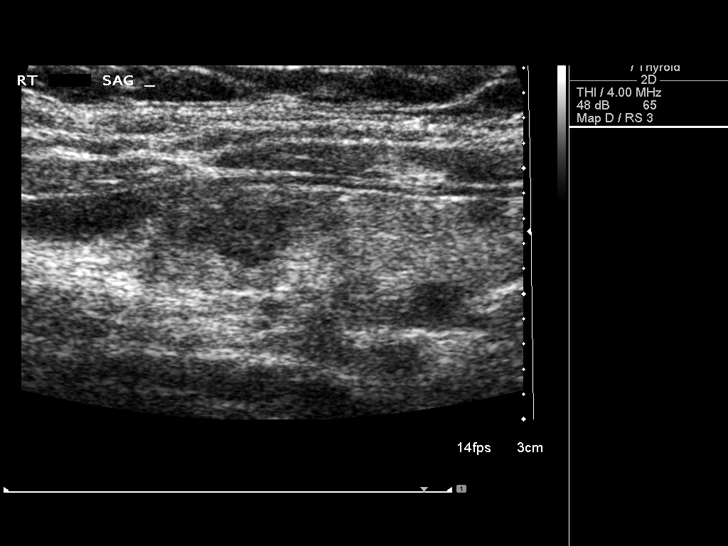
[im 13/51]
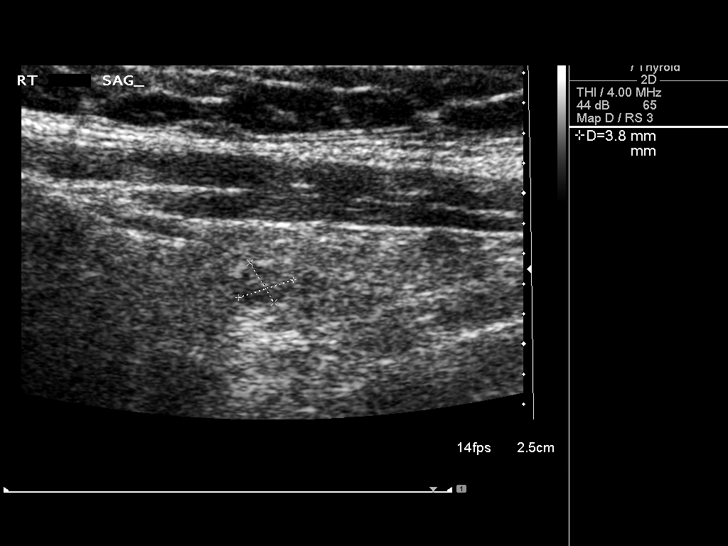
[im 17/51]
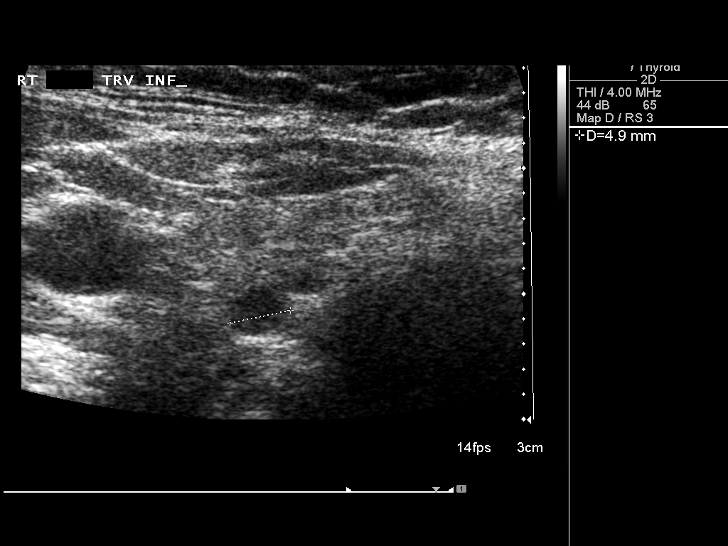
[im 19/51]
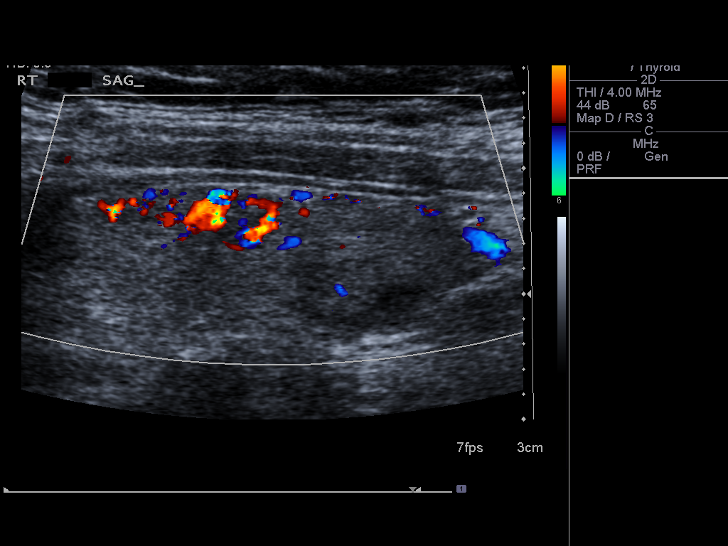
[im 23/51]
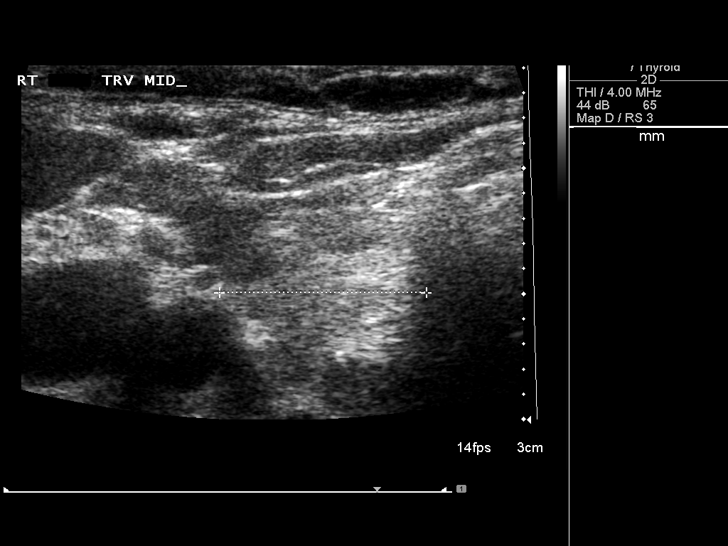
[im 28/51]
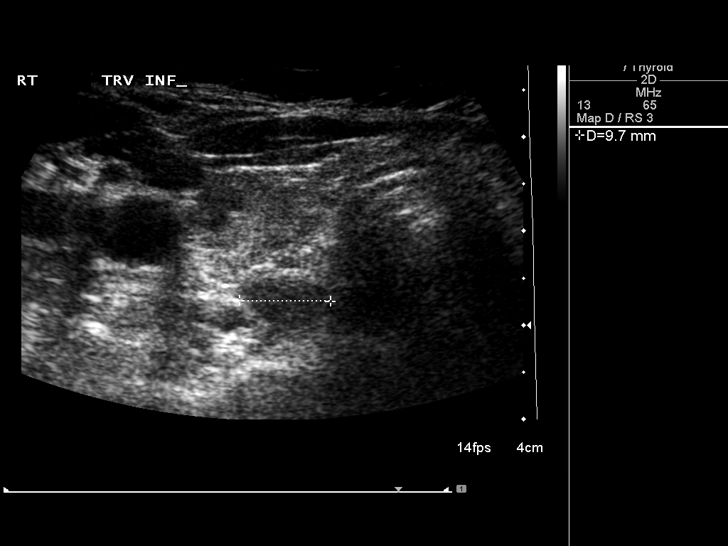
[im 32/51]
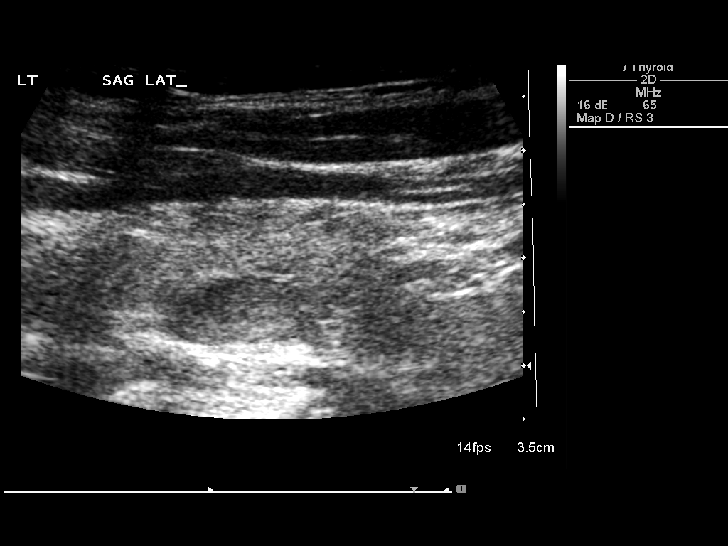
[im 34/51]
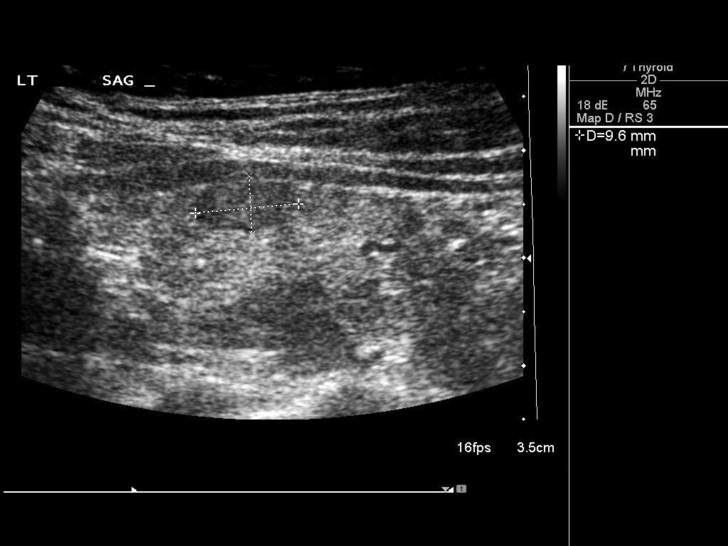
[im 38/51]
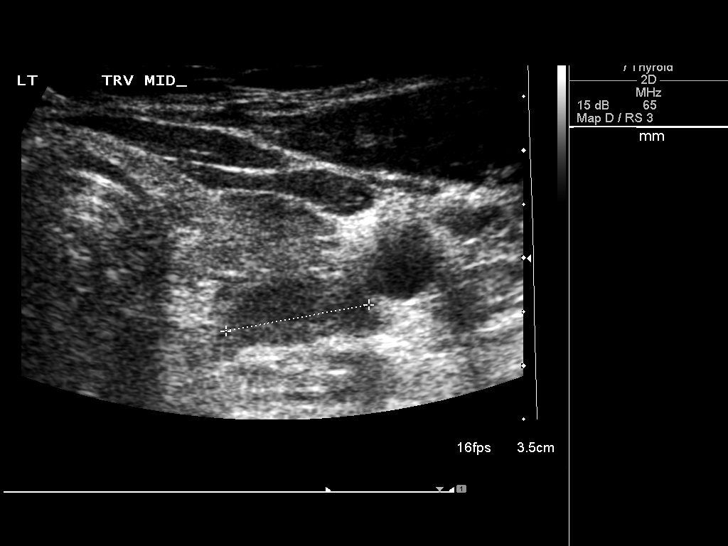
[im 42/51]
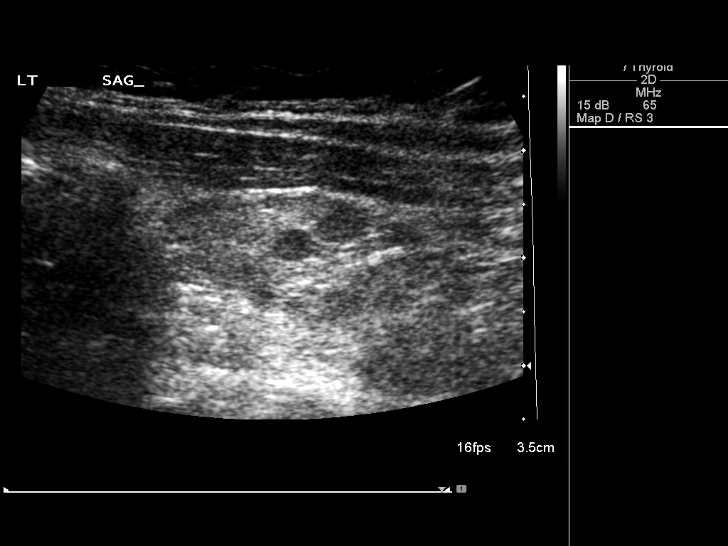
[im 46/51]
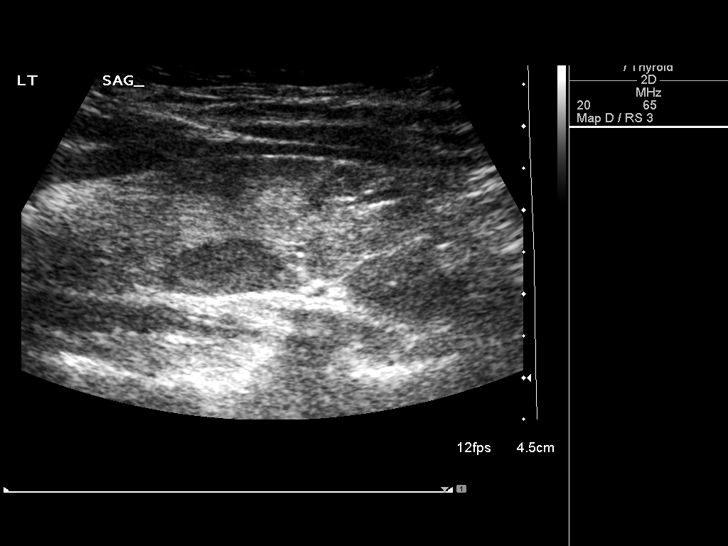
[im 51/51]
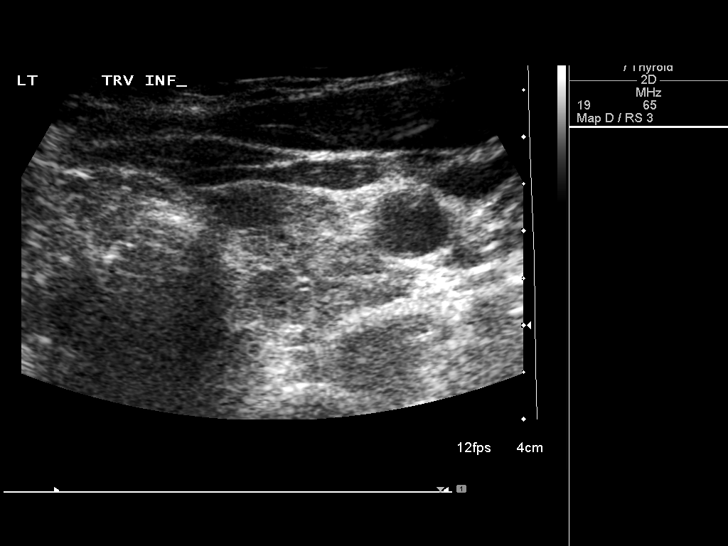

[14 of 25 positions shown; findings below may reference images not displayed]

FINDINGS: Right thyroid lobe:  Measures 4.7 x 1.7 x 1.7 cm and is
heterogeneous in echotexture.
Left thyroid lobe:  Measures 5.1 x 1.7 x 1.6 cm and is
heterogeneous in echotexture.
Isthmus:  Measures 4 mm.

Focal nodules:  Solid nodules are seen bilaterally, all with
similar imaging characteristics.  The largest on the right measures
1.5 x 0.6 x 1.1 cm in the upper pole.  The largest on the left
measures 1.4 x 0.7 x 1.4 cm in the interpolar region.

Lymphadenopathy:  None visualized.
IMPRESSION: Several solid bilateral thyroid nodules, all with similar imaging
characteristics, measuring up to 1.5 cm on the right.  Follow-up
could be performed in 6 months to ensure continued stability, as
clinically indicated.

## 2013-10-22 ENCOUNTER — Encounter (INDEPENDENT_AMBULATORY_CARE_PROVIDER_SITE_OTHER): Payer: Self-pay

## 2013-10-22 ENCOUNTER — Ambulatory Visit (INDEPENDENT_AMBULATORY_CARE_PROVIDER_SITE_OTHER): Payer: 59 | Admitting: Physician Assistant

## 2013-10-22 VITALS — BP 148/100 | HR 80 | Temp 98.9°F | Resp 14 | Ht 65.5 in | Wt 301.6 lb

## 2013-10-22 DIAGNOSIS — Z4651 Encounter for fitting and adjustment of gastric lap band: Secondary | ICD-10-CM

## 2013-10-22 NOTE — Progress Notes (Signed)
  HISTORY: Alicia Lawson is a 52 y.o.female who received an AP-Large lap-band in March 2013 by Dr. Daphine Deutscher. She comes in with about 1 lb weight loss since her last visit. She's been very careful about eating small portions but she consistently is able to eat more. She remains constantly hungry. She has no complaints of regurgitation or reflux. She is close to the 8 mL that was removed in late August, but she had that volume for close to a year without difficulty till the very end of that period.  VITAL SIGNS: Filed Vitals:   10/22/13 1349  BP: 148/100  Pulse: 80  Temp: 98.9 F (37.2 C)  Resp: 14    PHYSICAL EXAM: Physical exam reveals a very well-appearing 52 y.o.female in no apparent distress Neurologic: Awake, alert, oriented Psych: Bright affect, conversant Respiratory: Breathing even and unlabored. No stridor or wheezing Abdomen: Soft, nontender, nondistended to palpation. Incisions well-healed. No incisional hernias. Port easily palpated. Extremities: Atraumatic, good range of motion.  ASSESMENT: 52 y.o.  female  s/p AP-Large lap-band.   PLAN: The patient's port was accessed with a 20G Huber needle without difficulty. Clear fluid was aspirated and 0.5 mL saline was added to the port to give a total predicted volume of 8 mL. The patient was able to swallow water without difficulty following the procedure and was instructed to take clear liquids for the next 24-48 hours and advance slowly as tolerated. We will have her back in three months or sooner if needed.

## 2013-10-22 NOTE — Patient Instructions (Signed)

## 2013-11-10 IMAGING — US US THYROID BIOPSY
1 series · 13 of 21 positions shown · non-contrast
Comparison: none

CLINICAL DATA: Patient with history of goiter and thyroid
ultrasound at [HOSPITAL] on 08/27/2011 which revealed
multiple bilateral thyroid nodules.  There is a dominant nodule in
the right upper pole measuring 1.5 x 0.6 x 1.1 cm.  There is also a
dominant nodule in the left interpolar region measuring 1.4 x 0.7 x
1.4 cm.  Request is now made for needle aspirate biopsies of both
right and left dominant thyroid lobe nodules.

[Series 1: us thyroid biopsy · 0.07mm/px · 21 acquisitions, 13 frames shown]
[im 1/21]
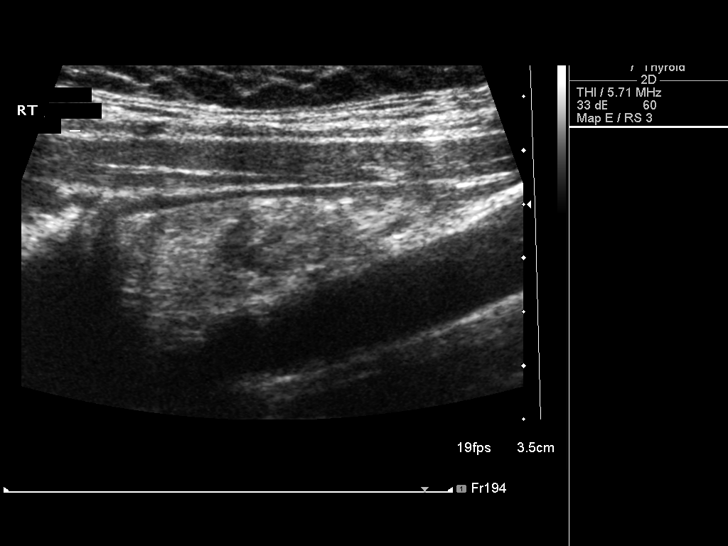
[im 3/21]
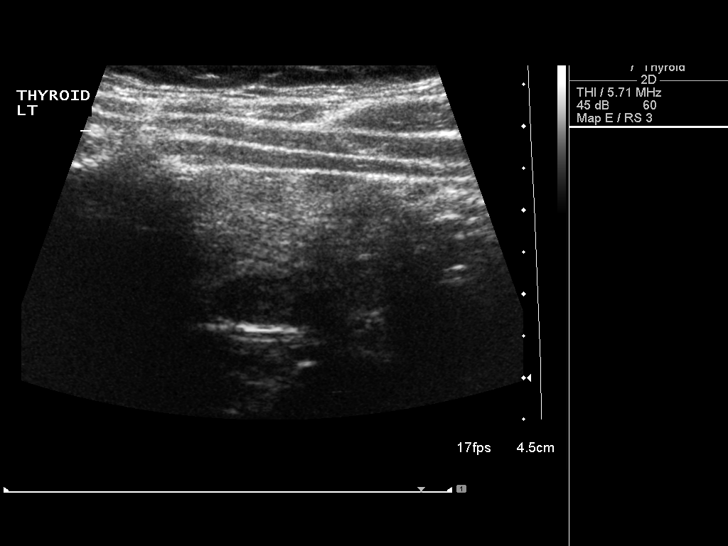
[im 5/21]
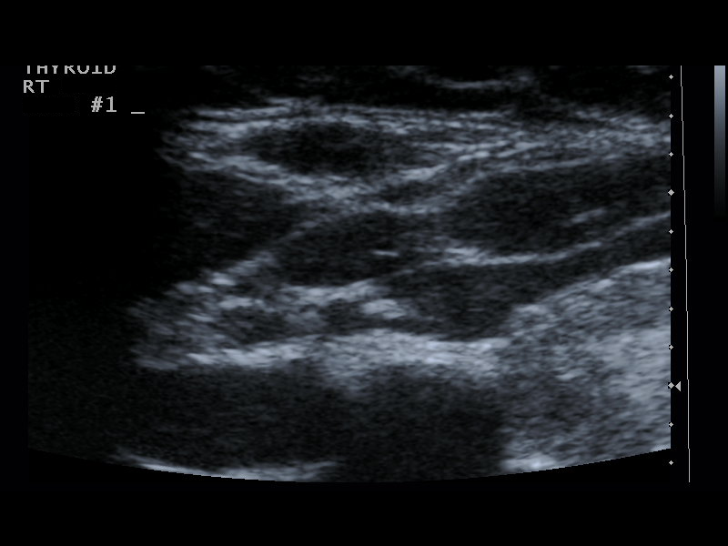
[im 6/21]
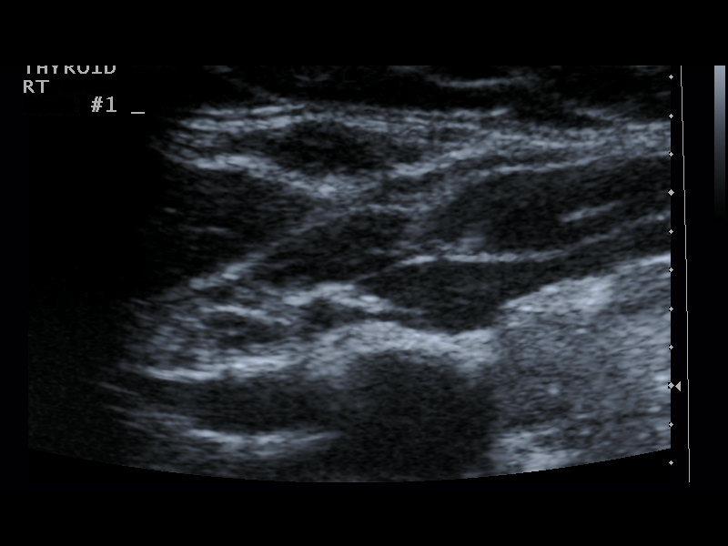
[im 8/21]
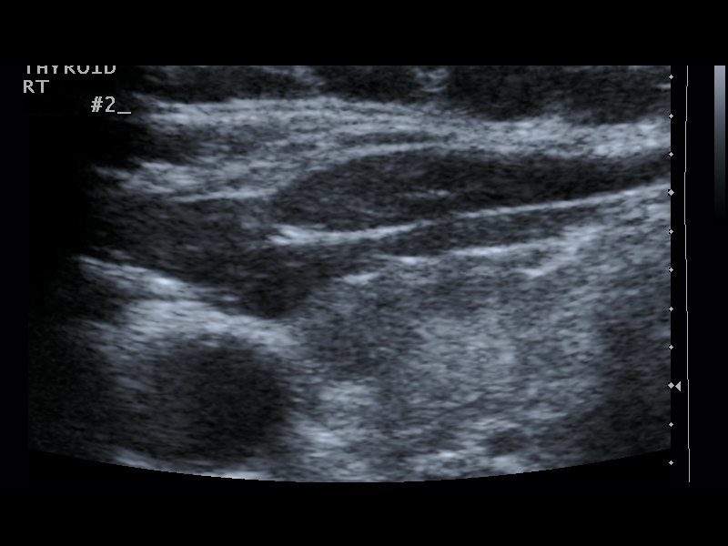
[im 9/21]
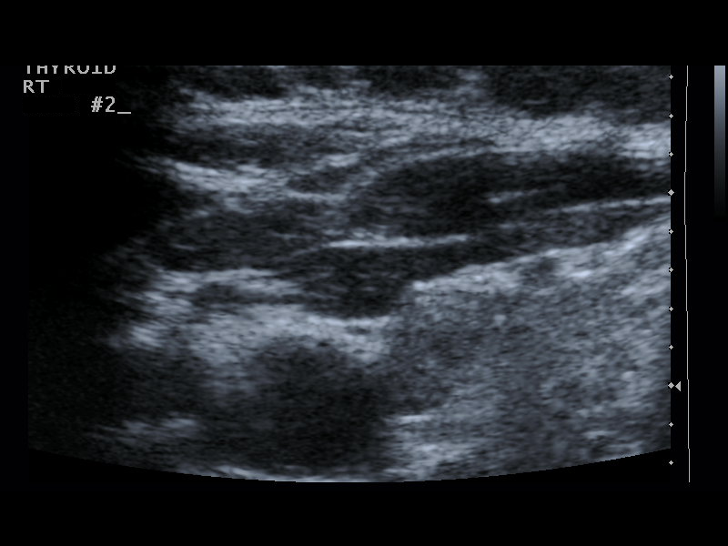
[im 11/21]
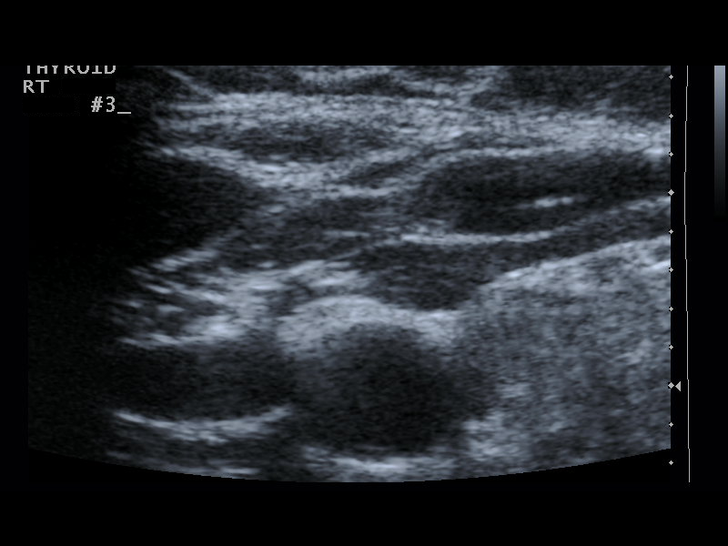
[im 13/21]
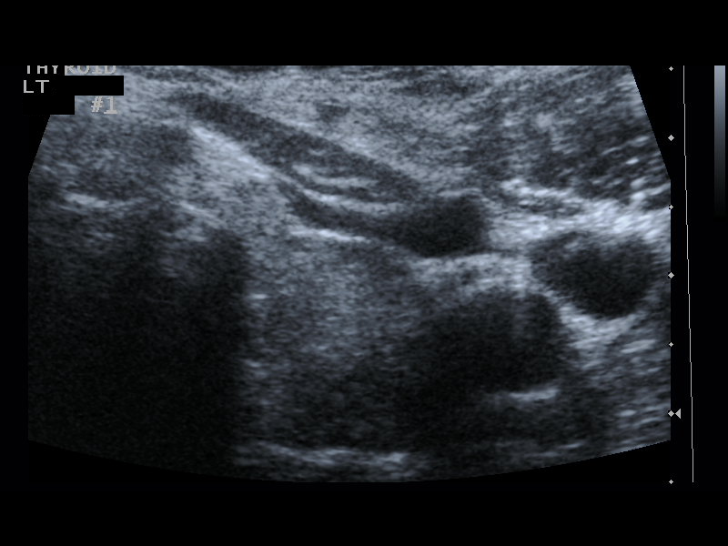
[im 14/21]
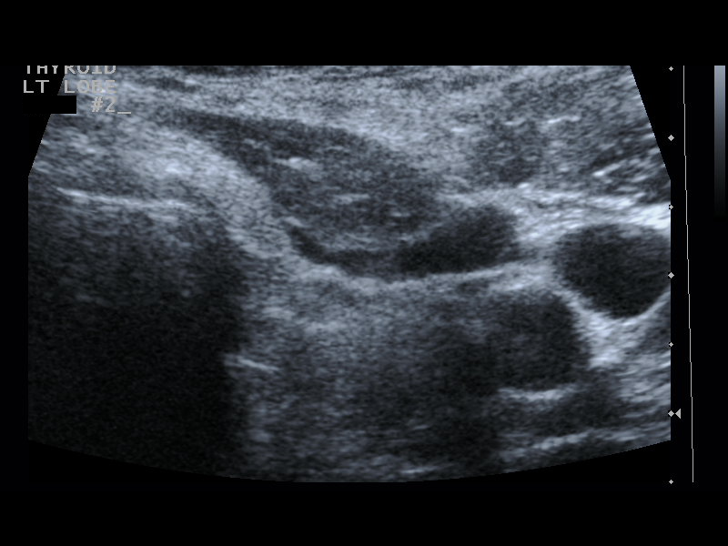
[im 16/21]
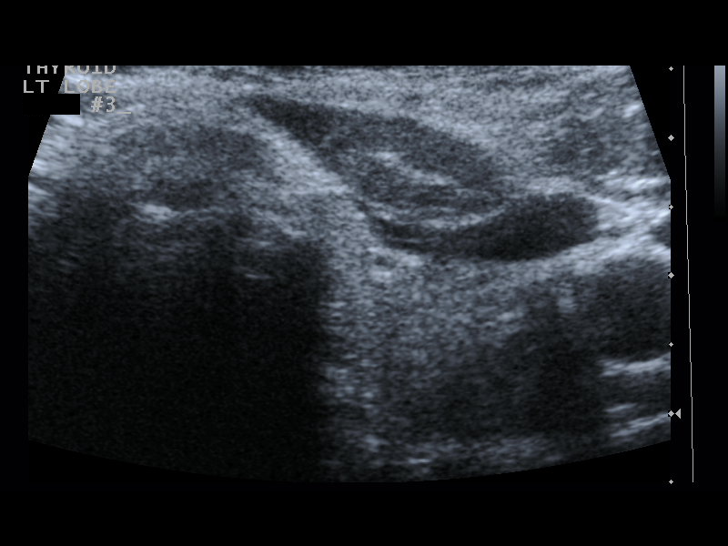
[im 17/21]
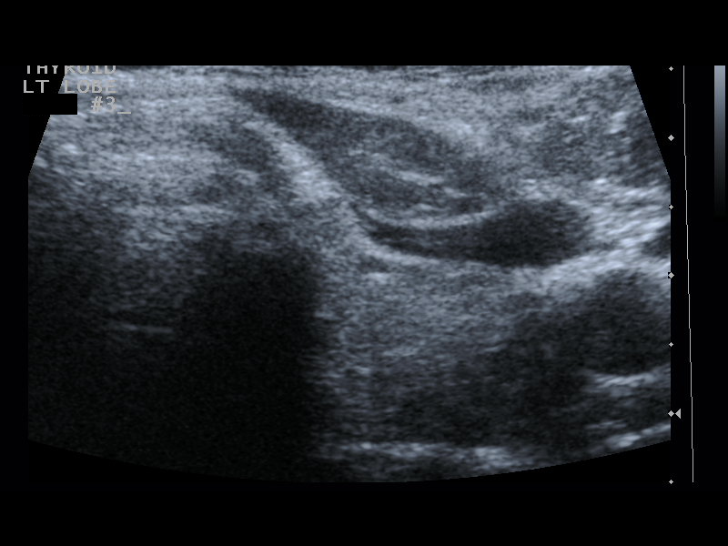
[im 19/21]
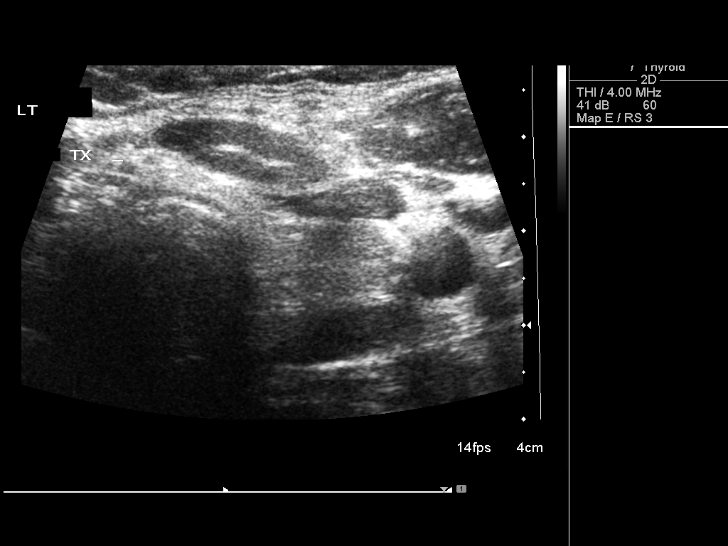
[im 21/21]
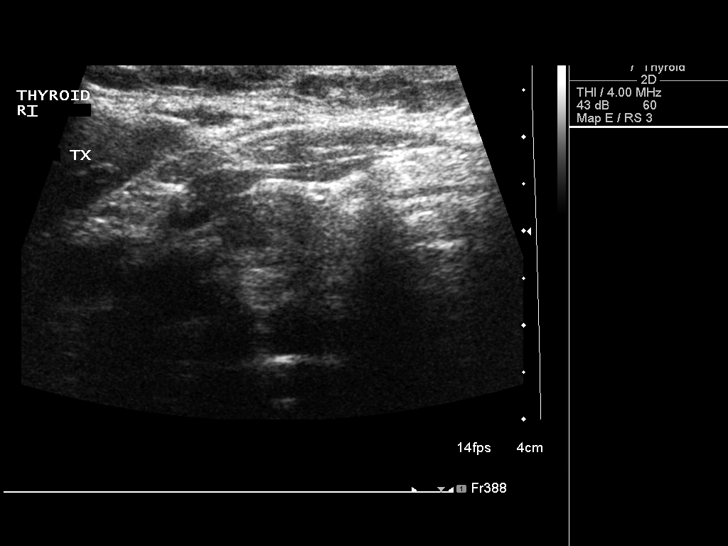

[13 of 21 positions shown; findings below may reference images not displayed]

ULTRASOUND GUIDED NEEDLE ASPIRATE BIOPSY , DOMINANT RIGHT UPPER
POLE THYROID NODULE

Thyroid biopsy was thoroughly discussed with the patient and
questions were answered.  The benefits, risks, alternatives, and
complications were also discussed.  The patient understands and
wishes to proceed with the procedure.  Written consent was
obtained.

Ultrasound was performed to localize and mark an adequate site for
the biopsy.  The patient was then prepped and draped in a normal
sterile fashion.  Local anesthesia was provided with 1% lidocaine.
Using direct ultrasound guidance, 3 passes were made using 25 gauge
needles into the dominant nodule within the right upper pole of the
thyroid.  Ultrasound was used to confirm needle placements on all
occasions.  Specimens were sent to Pathology for analysis.

ULTRASOUND GUIDED NEEDLE ASPIRATE BIOPSY, DOMINANT LEFT MID POLE
THYROID NODULE

Ultrasound was performed to localize and mark an adequate site for
the biopsy.  The patient was then prepped and draped in a normal
sterile fashion.  Local anesthesia was provided with 1% lidocaine.
Under direct ultrasound guidance , three passes were made using 25
gauge needles into the dominant nodule within the left mid pole of
the thyroid.  Ultrasound was used to confirm needle placements on
all occasions.  Specimens were sent to pathology for analysis.

Complications:  None
IMPRESSION: Ultrasound guided needle aspirate biopsies performed of the
dominant right upper pole and left mid pole thyroid nodules. Final
pathology pending.

Read by: Amazigh, Quirijn.-CHESS

## 2014-01-21 ENCOUNTER — Encounter (INDEPENDENT_AMBULATORY_CARE_PROVIDER_SITE_OTHER): Payer: 59

## 2015-07-04 ENCOUNTER — Other Ambulatory Visit: Payer: Self-pay | Admitting: Internal Medicine

## 2015-07-04 DIAGNOSIS — E041 Nontoxic single thyroid nodule: Secondary | ICD-10-CM

## 2015-07-08 ENCOUNTER — Ambulatory Visit
Admission: RE | Admit: 2015-07-08 | Discharge: 2015-07-08 | Disposition: A | Discharge status: Home / Self Care | Payer: 59 | Source: Ambulatory Visit | Attending: Internal Medicine | Admitting: Internal Medicine

## 2015-07-08 DIAGNOSIS — E041 Nontoxic single thyroid nodule: Secondary | ICD-10-CM

## 2015-07-28 ENCOUNTER — Ambulatory Visit (INDEPENDENT_AMBULATORY_CARE_PROVIDER_SITE_OTHER): Payer: 59

## 2015-07-28 ENCOUNTER — Ambulatory Visit (INDEPENDENT_AMBULATORY_CARE_PROVIDER_SITE_OTHER): Payer: 59 | Admitting: Podiatry

## 2015-07-28 ENCOUNTER — Encounter: Payer: Self-pay | Admitting: Podiatry

## 2015-07-28 DIAGNOSIS — M79673 Pain in unspecified foot: Secondary | ICD-10-CM | POA: Diagnosis not present

## 2015-07-28 DIAGNOSIS — M722 Plantar fascial fibromatosis: Secondary | ICD-10-CM | POA: Diagnosis not present

## 2015-07-28 MED ORDER — DICLOFENAC-MISOPROSTOL 75-0.2 MG PO TBEC: 75.0000 mg | DELAYED_RELEASE_TABLET | Freq: Two times a day (BID) | ORAL | Status: AC

## 2015-07-28 MED ORDER — TRIAMCINOLONE ACETONIDE 10 MG/ML IJ SUSP
10.0000 mg | Freq: Once | INTRAMUSCULAR | Status: AC
  Administered 2015-07-28: 10 mg

## 2015-07-28 NOTE — Progress Notes (Signed)
   Subjective:    Patient ID: Alicia Lawson, female    DOB: 1961/09/11, 54 y.o.   MRN: [id number]  HPI Pt presents with left heel pain x 2 months , worse in the mornings, has tried splinting and icing with no relief   Review of Systems  Cardiovascular: Positive for leg swelling.  Musculoskeletal: Positive for back pain and arthralgias.  All other systems reviewed and are negative.      Objective:   Physical Exam        Assessment & Plan:

## 2015-07-28 NOTE — Progress Notes (Signed)
Subjective:     Patient ID: Alicia Lawson, female   DOB: 05-19-1961, 54 y.o.   MRN: [id number]  HPI patient states I've had a lot of pain in my left heel that's been present for several months. I'm due to go out of town I like to try to get better before I leave and it doesn't seem to be getting better with exercising and stretching   Review of Systems  All other systems reviewed and are negative.      Objective:   Physical Exam  Constitutional: She is oriented to person, place, and time.  Cardiovascular: Intact distal pulses.   Musculoskeletal: Normal range of motion.  Neurological: She is oriented to person, place, and time.  Skin: Skin is warm.  Nursing note and vitals reviewed.  neurovascular status intact muscle strength adequate range of motion within normal limits with patient noted to have quite a bit of discomfort in the plantar aspect left heel at the insertional point tendon the calcaneus with fluid buildup around the medial band. Patient had a history of this on the right one which is mildly tender at the current time and is also had a history of depression of the arch. Well oriented 3 with good digital perfusion was noted     Assessment:     Acute plantar fasciitis left with mild discomfort in the right heel with depression of the arch noted    Plan:     H&P and x-rays reviewed with patient. Injected the plantar fascia 3 Milligan Kenalog 5 g Xylocaine and applied fascial brace and instructed on physical therapy. Reappoint 2 weeks when she returns from [employer]

## 2015-07-28 NOTE — Patient Instructions (Signed)

## 2015-07-29 ENCOUNTER — Other Ambulatory Visit: Payer: Self-pay | Admitting: Internal Medicine

## 2015-07-29 DIAGNOSIS — E041 Nontoxic single thyroid nodule: Secondary | ICD-10-CM

## 2015-08-10 ENCOUNTER — Ambulatory Visit
Admission: RE | Admit: 2015-08-10 | Discharge: 2015-08-10 | Disposition: A | Discharge status: Home / Self Care | Payer: 59 | Source: Ambulatory Visit | Attending: Internal Medicine | Admitting: Internal Medicine

## 2015-08-10 ENCOUNTER — Other Ambulatory Visit (HOSPITAL_COMMUNITY)
Admission: RE | Admit: 2015-08-10 | Discharge: 2015-08-10 | Disposition: A | Discharge status: Home / Self Care | Payer: 59 | Source: Ambulatory Visit | Attending: Radiology | Admitting: Radiology

## 2015-08-10 DIAGNOSIS — E041 Nontoxic single thyroid nodule: Secondary | ICD-10-CM

## 2015-08-11 ENCOUNTER — Ambulatory Visit (INDEPENDENT_AMBULATORY_CARE_PROVIDER_SITE_OTHER): Payer: 59 | Admitting: Podiatry

## 2015-08-11 ENCOUNTER — Encounter: Payer: Self-pay | Admitting: Podiatry

## 2015-08-11 DIAGNOSIS — M722 Plantar fascial fibromatosis: Secondary | ICD-10-CM | POA: Diagnosis not present

## 2015-08-11 MED ORDER — TRIAMCINOLONE ACETONIDE 10 MG/ML IJ SUSP
10.0000 mg | Freq: Once | INTRAMUSCULAR | Status: AC
  Administered 2015-08-11: 10 mg

## 2015-08-11 NOTE — Progress Notes (Signed)
Subjective:     Patient ID: Alicia Lawson, female   DOB: May 21, 1961, 54 y.o.   MRN: [id number]  HPI patient states my heel is doing better but it still sore if I been on it too long   Review of Systems     Objective:   Physical Exam Neurovascular status improving with discomfort plantar heel left that's improved but present    Assessment:     Plantar fasciitis left improving but still present    Plan:     Instructed on physical therapy reinjected the plantar fascia 3 Milligan Kenalog 5 mill grams Xylocaine and advised on reduced activity

## 2016-07-12 ENCOUNTER — Encounter: Payer: Self-pay | Admitting: Internal Medicine

## 2016-07-16 ENCOUNTER — Encounter: Payer: Self-pay | Admitting: Internal Medicine

## 2016-08-08 ENCOUNTER — Encounter (HOSPITAL_COMMUNITY): Payer: Self-pay

## 2017-08-05 ENCOUNTER — Encounter (HOSPITAL_COMMUNITY): Payer: Self-pay

## 2017-09-16 IMAGING — US US THYROID BIOPSY
1 series · 13 of 15 positions shown · non-contrast
Comparison: US Soft Tissue Head Neck 07/08/2015

MEDICATIONS:
None

COMPLICATIONS:
None immediate

INDICATION: Indeterminate thyroid nodule

EXAM:
ULTRASOUND GUIDED FINE NEEDLE ASPIRATION OF INDETERMINATE THYROID
NODULE
TECHNIQUE: Informed written consent was obtained from the patient after a
discussion of the risks, benefits and alternatives to treatment.
Questions regarding the procedure were encouraged and answered. A
timeout was performed prior to the initiation of the procedure.

[Series 1: us thyroid biopsy · 0.08mm/px · 15 acquisitions, 13 frames shown]
[im 1/15]
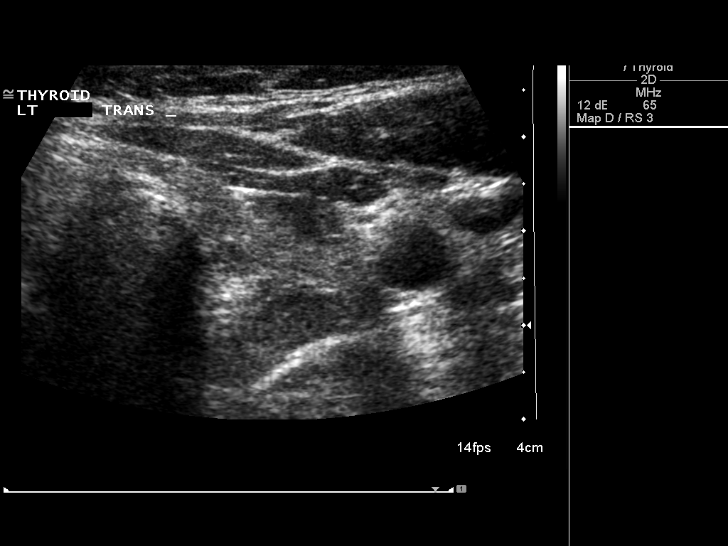
[im 2/15]
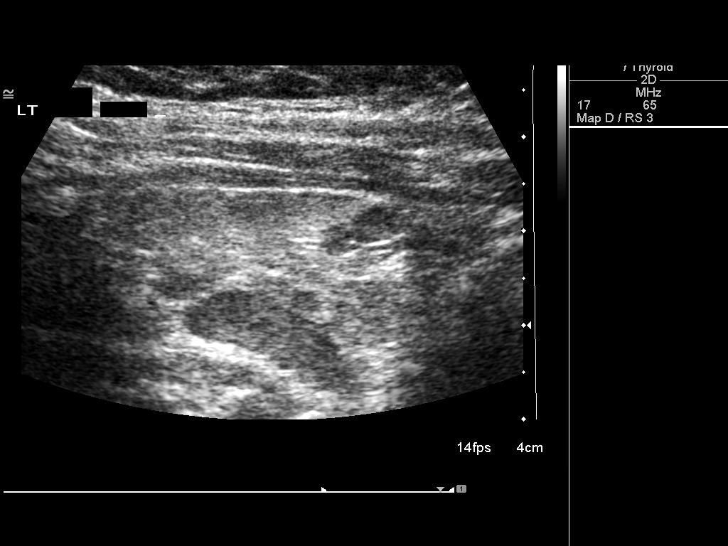
[im 3/15]
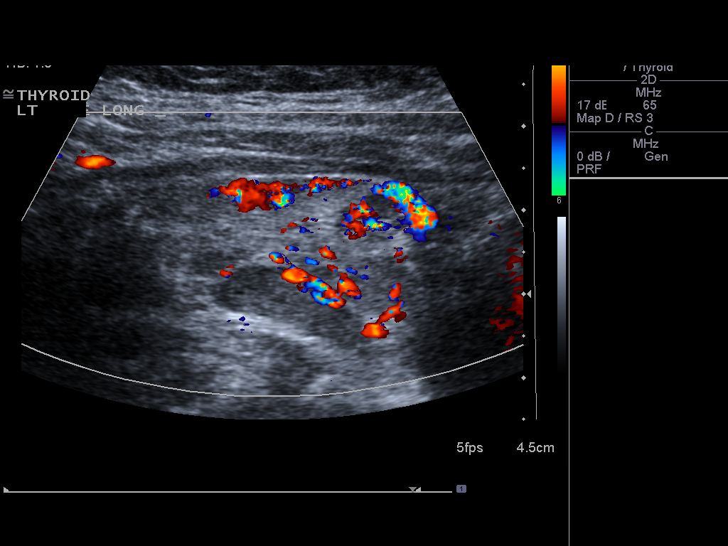
[im 5/15]
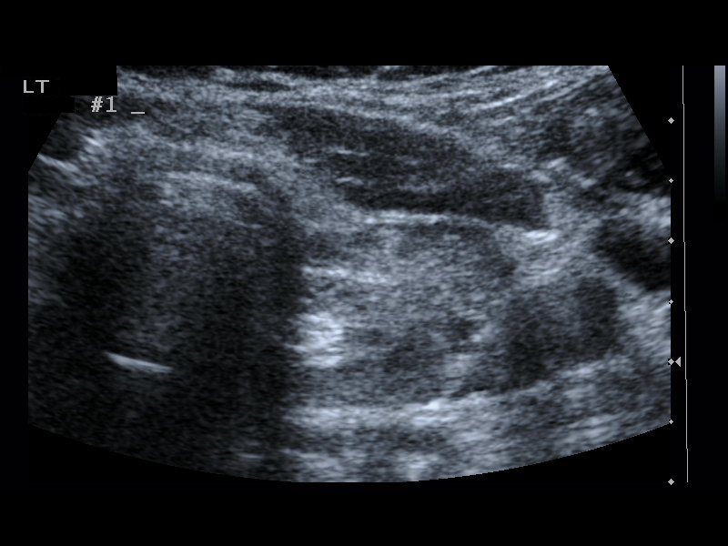
[im 6/15]
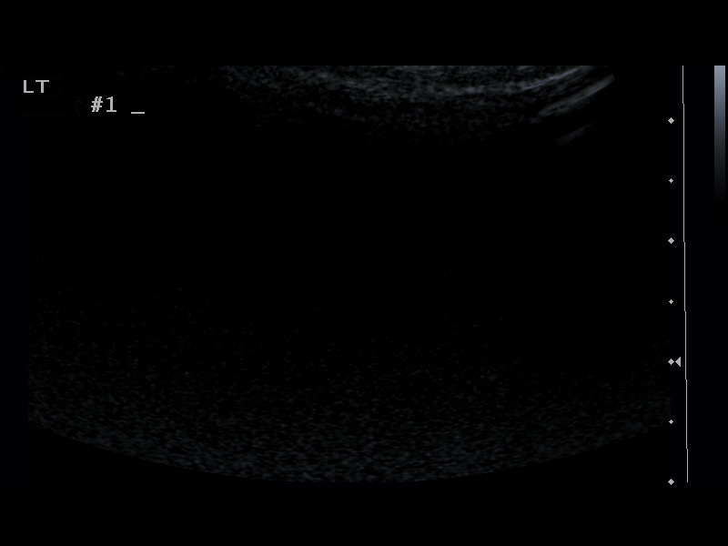
[im 7/15]
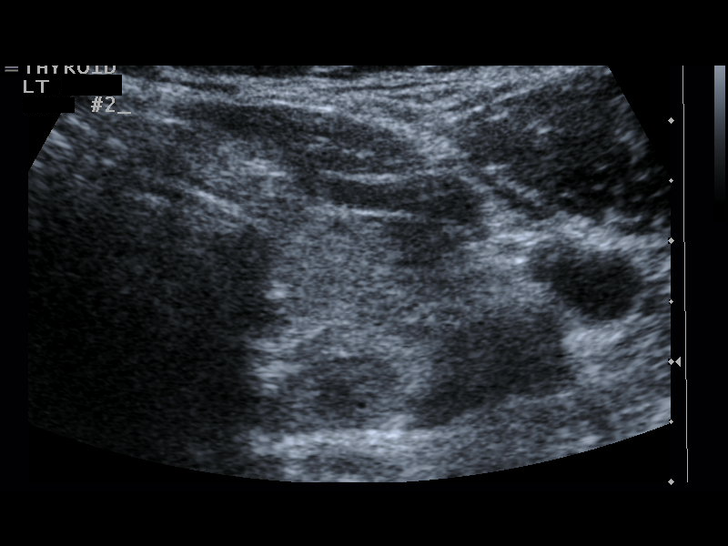
[im 8/15]
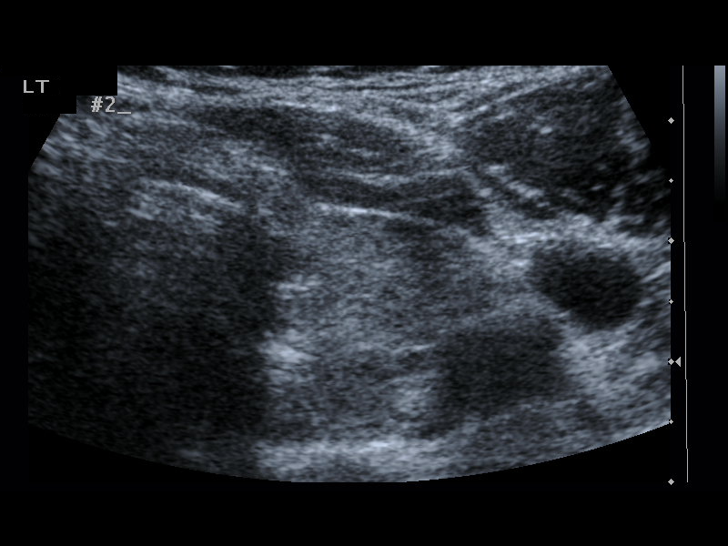
[im 9/15]
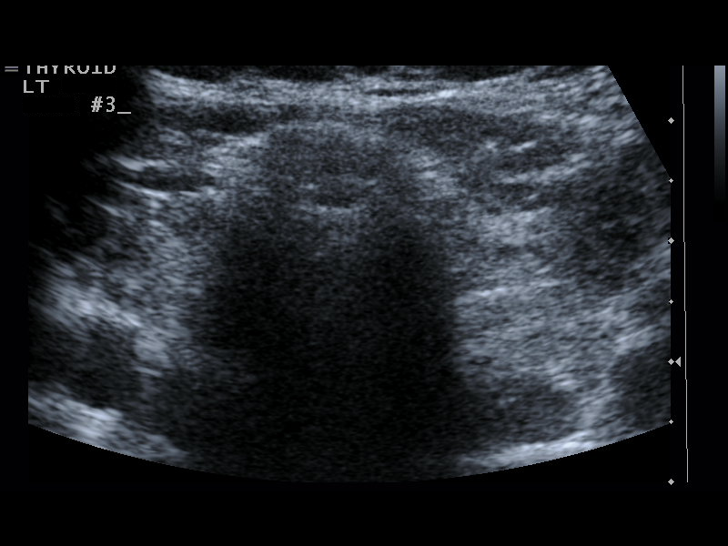
[im 10/15]
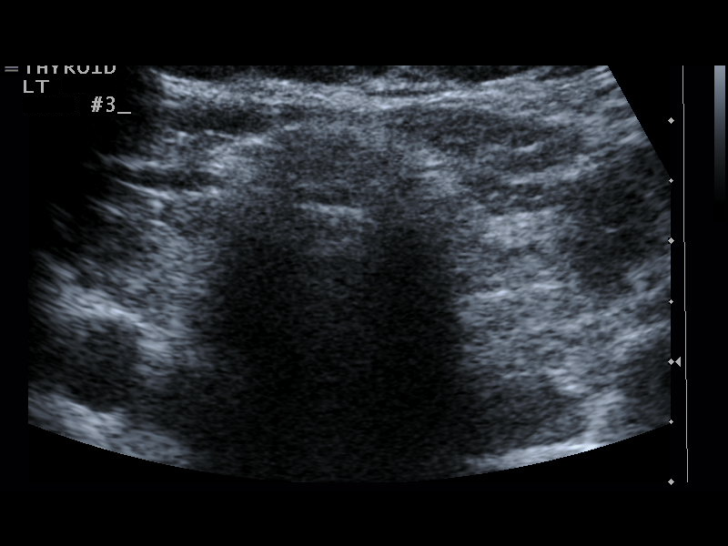
[im 11/15]
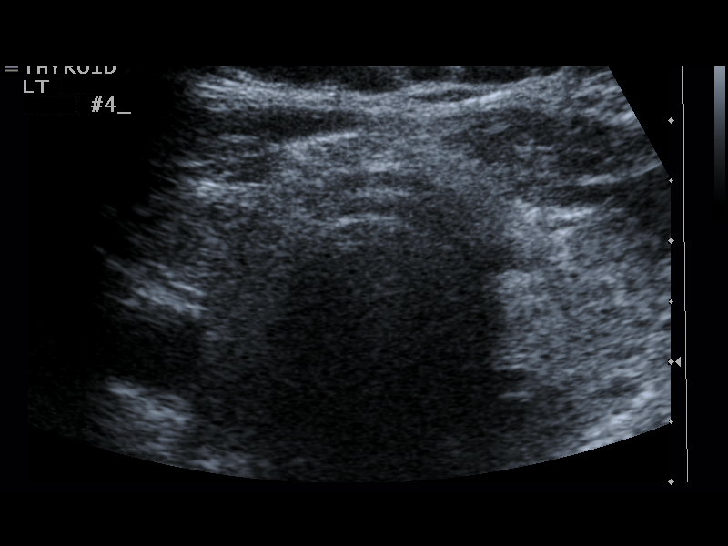
[im 13/15]
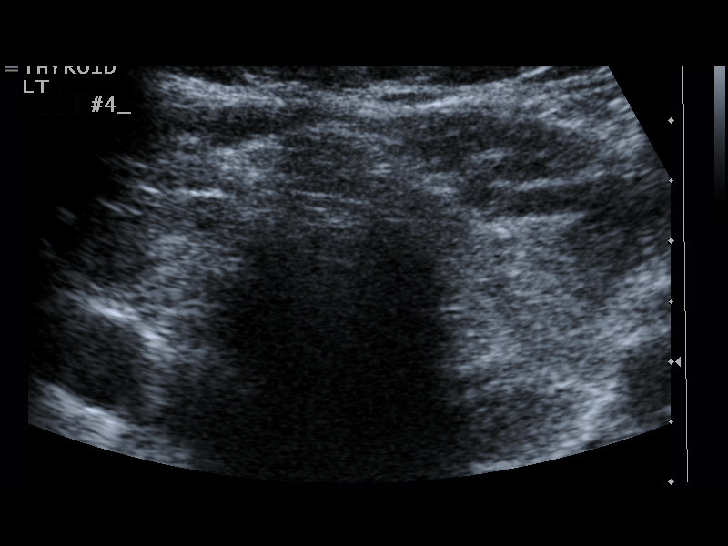
[im 14/15]
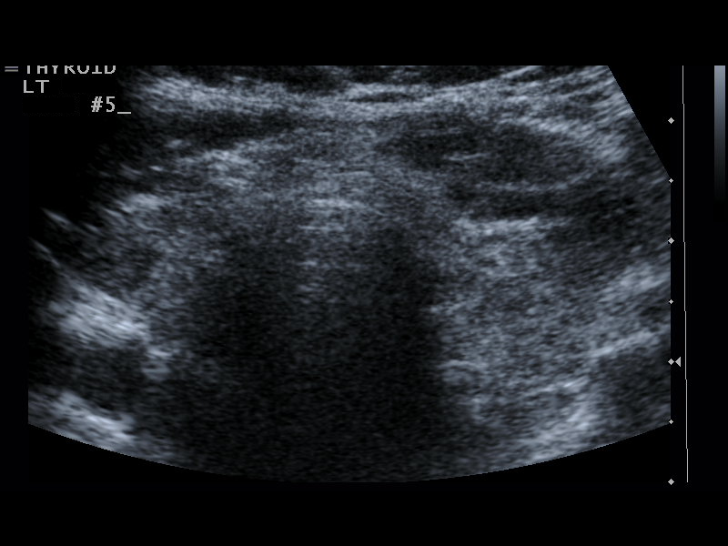
[im 15/15]
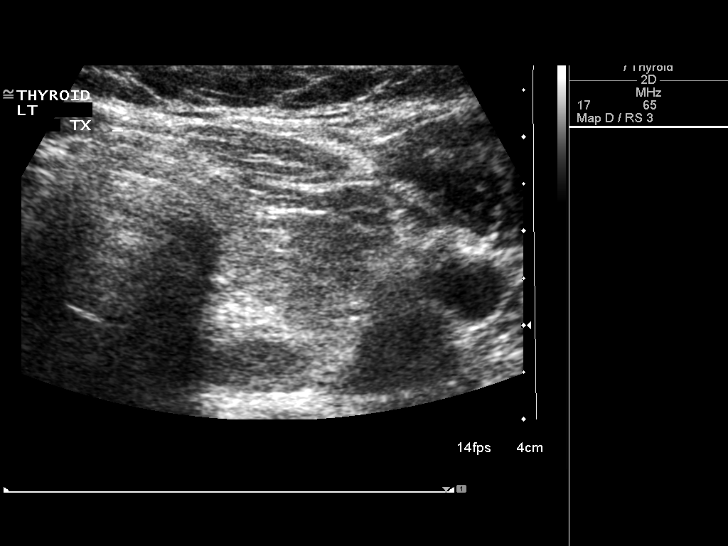

[13 of 15 positions shown; findings below may reference images not displayed]

Pre-procedural ultrasound scanning demonstrated left deep mid lobe
hypoechoic thyroid nodule measuring 2.0 x 0.8 x 1.4 cm

The procedure was planned. The neck was prepped in the usual sterile
fashion, and a sterile drape was applied covering the operative
field. A timeout was performed prior to the initiation of the
procedure. Local anesthesia was provided with 1% lidocaine.

Under direct ultrasound guidance, 5 FNA biopsies were performed, (2)
of these samples were sent for Afirma testing of the left deep mid
lobe hypoechoic thyroid nodule with a 27 gauge needle. The samples
were prepared and submitted to pathology.

Limited post procedural scanning was negative for hematoma or
additional complication. Dressings were placed. The patient
tolerated the above procedures procedure well without immediate
postprocedural complication.
IMPRESSION: Technically successful ultrasound guided fine needle aspiration of
left deep mid lobe hypoechoic thyroid nodule.

## 2018-08-04 ENCOUNTER — Encounter (HOSPITAL_COMMUNITY): Payer: Self-pay

## 2020-06-17 ENCOUNTER — Encounter: Payer: Self-pay | Admitting: Genetic Counselor
